# Patient Record
Sex: Female | Born: 1995 | Hispanic: Yes | Marital: Married | Smoking: Never smoker
Health system: Southern US, Community
[De-identification: ages and names within clinical notes are randomized; demographics above are authoritative.]

## PROBLEM LIST (undated history)

## (undated) HISTORY — PX: APPENDECTOMY: SHX54

## (undated) HISTORY — PX: EYE MUSCLE SURGERY: SHX370

---
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
## 2016-04-12 IMAGING — CT CT ABD-PELV W/ CM
2 of 4 series · 16 of 46 positions shown, 18 images · IV contrast (omnipaque)
Comparison: Right upper quadrant ultrasound 6360 hours today.

ADDENDUM:
Study discussed by telephone with RN Rosemond Johnny in the ED on
03/30/2014 at 9997 hours.
CLINICAL DATA: 19-year-old female with upper abdominal pain nausea
and vomiting. Six days postpartum vaginal delivery. Initial
encounter.

EXAM:
CT ABDOMEN AND PELVIS WITH CONTRAST
TECHNIQUE: Multidetector CT imaging of the abdomen and pelvis was performed
using the standard protocol following bolus administration of
intravenous contrast.
CONTRAST:  100 mL Omnipaque 300.

[Series 2: routine abd pel with · axial · 0.67mm/px · z∈[-1196,-781]mm · 13 of 91 slices shown, 15 images]
[im 4/91  soft-tissue]
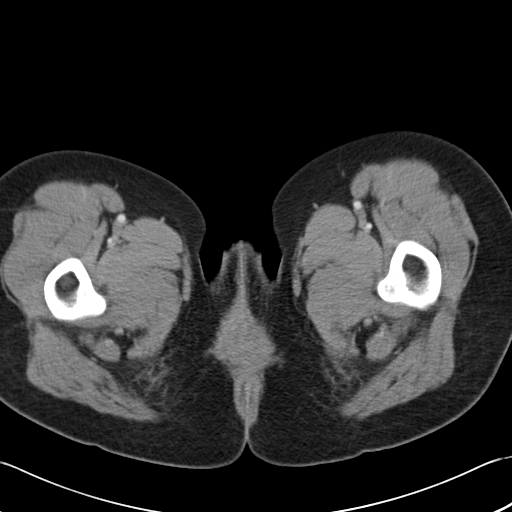
[im 4/91  bone]
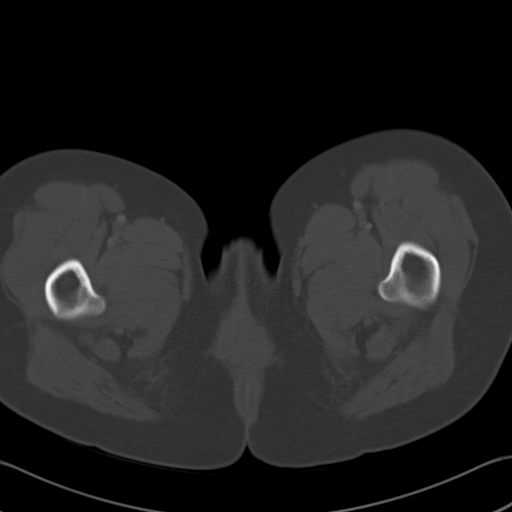
[im 11/91  soft-tissue]
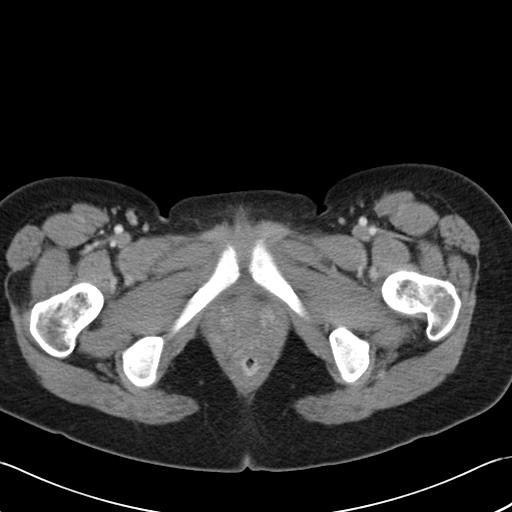
[im 18/91  soft-tissue]
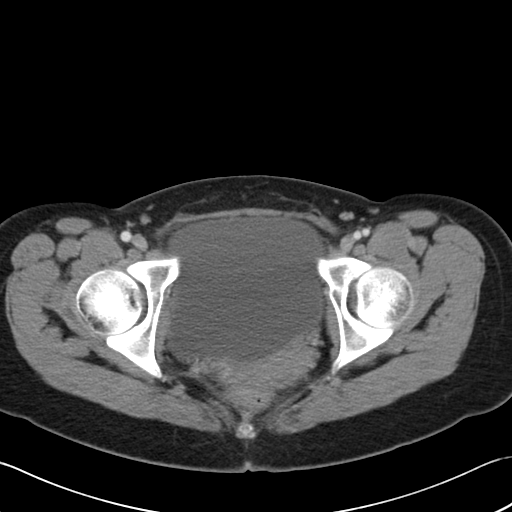
[im 25/91  soft-tissue]
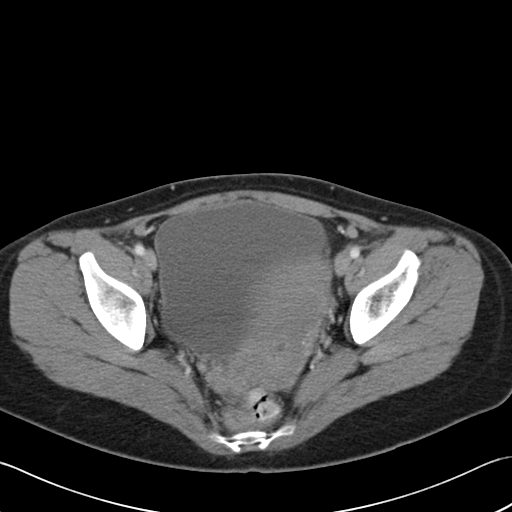
[im 32/91  soft-tissue]
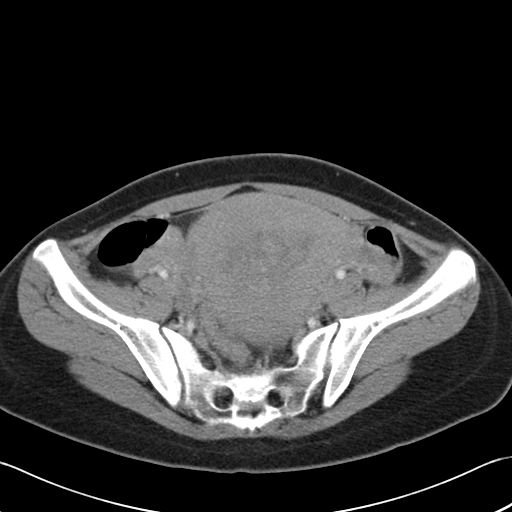
[im 39/91  soft-tissue]
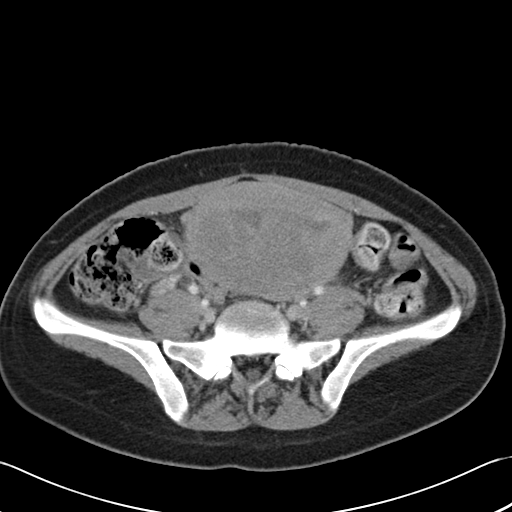
[im 46/91  soft-tissue]
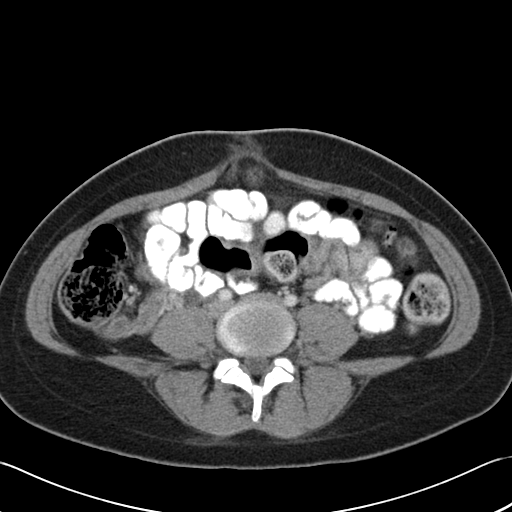
[im 52/91  soft-tissue]
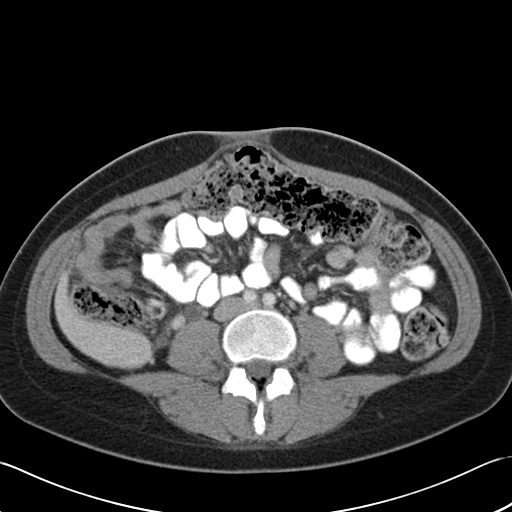
[im 59/91  soft-tissue]
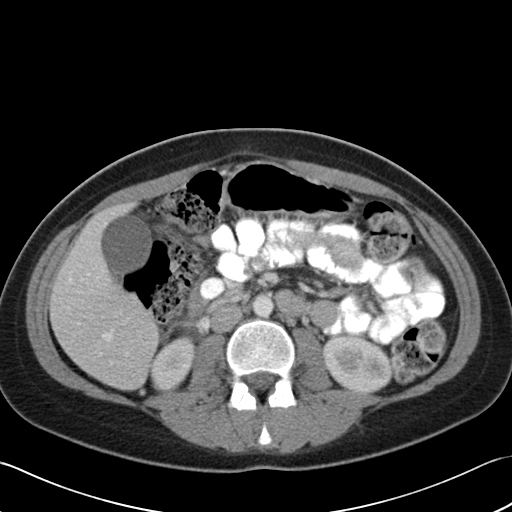
[im 59/91  bone]
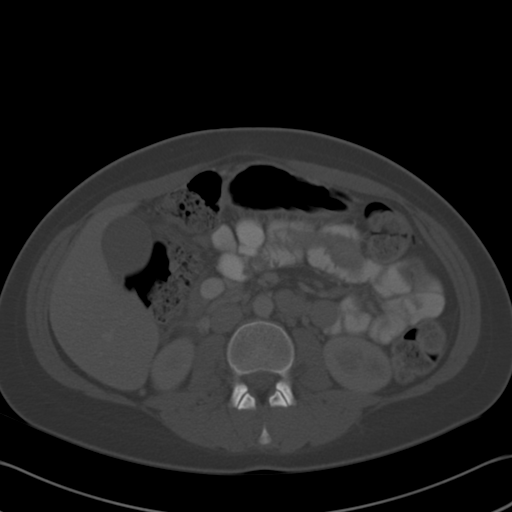
[im 66/91  soft-tissue]
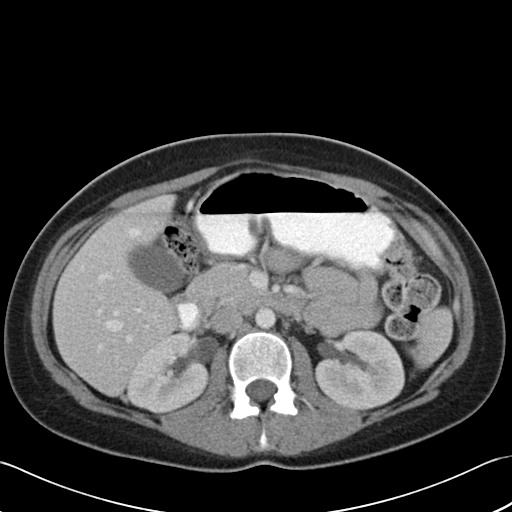
[im 73/91  soft-tissue]
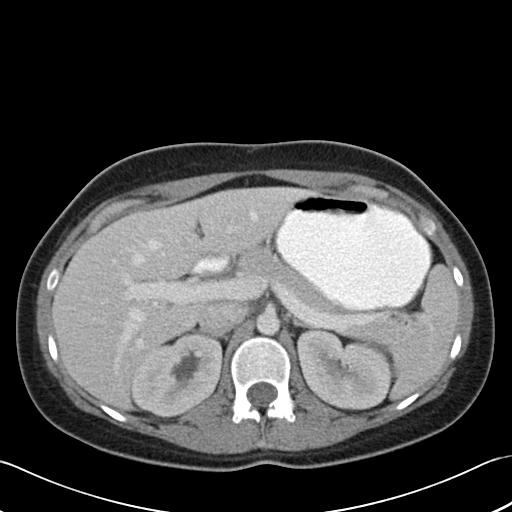
[im 80/91  soft-tissue]
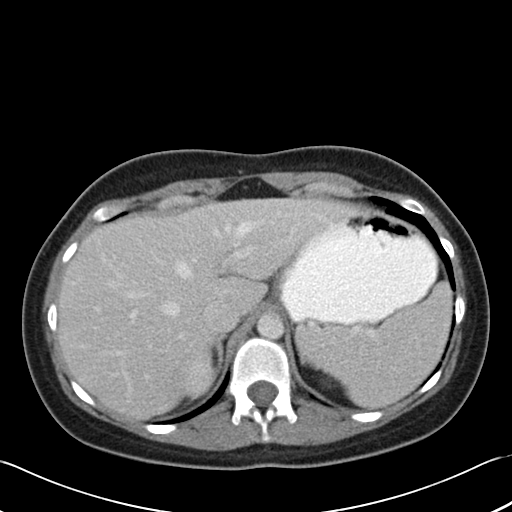
[im 87/91  soft-tissue]
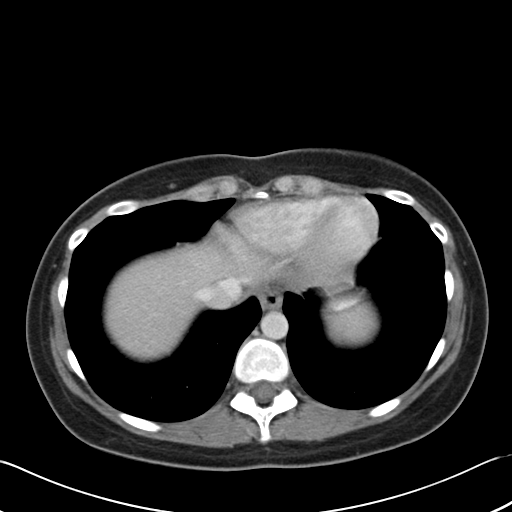

[Series 5: cor routine abd pel with · coronal · 0.67mm/px · 3 of 115 slices shown]
[im 39/115  soft-tissue]
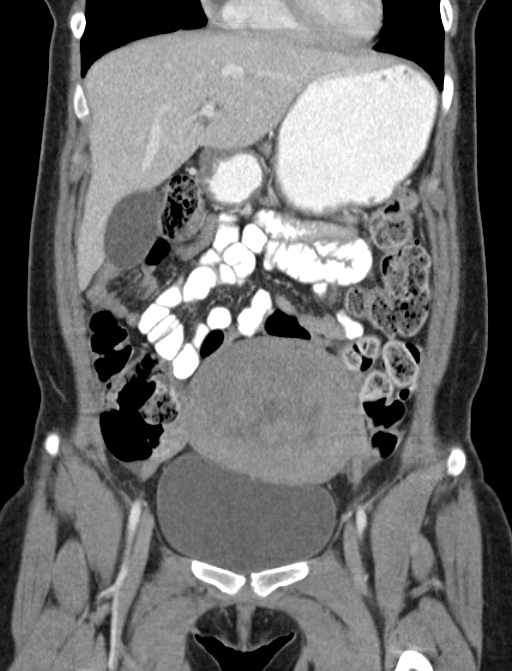
[im 51/115  soft-tissue]
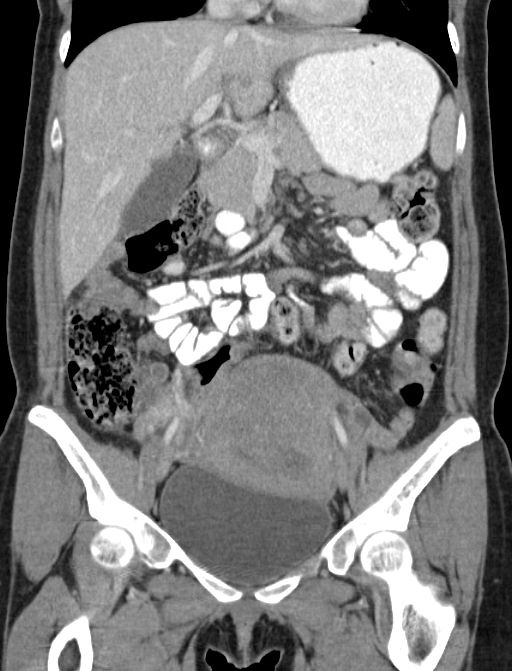
[im 64/115  soft-tissue]
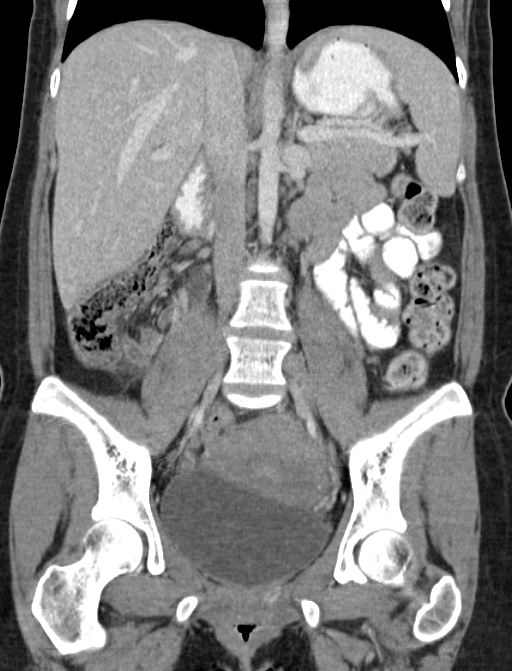

[16 of 46 positions shown; findings below may reference images not displayed]

FINDINGS: Negative lung bases.  No pleural or pericardial effusion.

Small volume gas in knee dorsal epidural space, likely related to
recent epidural anesthesia in this setting. No acute osseous
abnormality identified.

Postpartum uterus. No pelvic free fluid. Negative at adnexa.
Decompressed distal colon. Unremarkable bladder.

Retained stool in the more proximal colon.

There is a trace amount of free fluid in the right lower quadrant.
The appendix is dilated, fluid-filled, and hyperenhancing with
surrounding inflammation. Trace cecal wall thickening at the origin
of the appendix. Terminal ileum within normal limits. No dilated
small bowel. Oral contrast has not yet reached the distal small
bowel. Negative stomach and duodenum.

Liver, gallbladder, spleen, pancreas, and adrenal glands are within
normal limits. Portal venous system within normal limits. Major
arterial structures are patent. Left kidney in ureter are within
normal limits. There is mild right hydronephrosis, and mild to
moderate right hydroureter which appears related to compression from
the uterus. No free fluid in the upper abdomen. No lymphadenopathy.
IMPRESSION: 1. Acute appendicitis. Trace reactive appearing right lower quadrant
free fluid with no perforation or complicating features.
2. Postpartum uterus with unresolved right side maternal
hydronephrosis of pregnancy.
3. Trace dorsal lumbar epidural space gas compatible with recent
epidural anesthesia.

## 2016-05-28 ENCOUNTER — Encounter: Payer: Self-pay | Admitting: Advanced Practice Midwife

## 2016-05-28 ENCOUNTER — Ambulatory Visit (INDEPENDENT_AMBULATORY_CARE_PROVIDER_SITE_OTHER): Payer: BLUE CROSS/BLUE SHIELD | Admitting: Advanced Practice Midwife

## 2016-05-28 VITALS — BP 100/60 | HR 67 | Ht 63.0 in | Wt 134.0 lb

## 2016-05-28 DIAGNOSIS — Z01419 Encounter for gynecological examination (general) (routine) without abnormal findings: Secondary | ICD-10-CM | POA: Diagnosis not present

## 2016-05-28 DIAGNOSIS — Z113 Encounter for screening for infections with a predominantly sexual mode of transmission: Secondary | ICD-10-CM | POA: Diagnosis not present

## 2016-05-28 NOTE — Progress Notes (Signed)
Patient ID: Emily Mosley, female   DOB: 15-Oct-1995, 21 y.o.   MRN: [id number]     Gynecology Annual Exam  PCP: [facility]  Chief Complaint:  Chief Complaint  Patient presents with  . Gynecologic Exam    History of Present Illness: Patient is a 21 y.o. G1P1001 presents for annual exam. The patient has no complaints today.   LMP: Patient's last menstrual period was 05/27/2016. Average Interval: regular, 28 days Duration of flow: 3 days Heavy Menses: no Clots: no Intermenstrual Bleeding: no Postcoital Bleeding: no Dysmenorrhea: no  The patient is sexually active. She currently uses IUD Mirena for contraception. She denies dyspareunia.  The patient does not perform self breast exams.  There is no notable family history of breast or ovarian cancer in her family.  The patient wears seatbelts: yes.  The patient has regular exercise: yes.    The patient denies current symptoms of depression.    Review of Systems: Review of Systems  Constitutional: Negative.   HENT: Negative.   Eyes: Negative.   Respiratory: Negative.   Cardiovascular: Negative.   Gastrointestinal: Negative.   Genitourinary: Negative.   Musculoskeletal: Negative.   Skin: Negative.   Neurological: Negative.   Endo/Heme/Allergies: Negative.   Psychiatric/Behavioral: Negative.     Past Medical History:  No past medical history on file.  Past Surgical History:  Past Surgical History:  Procedure Laterality Date  . APPENDECTOMY      Gynecologic History:  Patient's last menstrual period was 05/27/2016. Contraception: IUD Last Pap: Results were: Pt has not had a PAP smear   Obstetric History: G1P1001  Family History:  No family history on file.  Social History:  Social History   Social History  . Marital status: Single    Spouse name: N/A  . Number of children: N/A  . Years of education: N/A   Occupational History  . Not on file.   Social History Main Topics  .  Smoking status: Never Smoker  . Smokeless tobacco: Never Used  . Alcohol use No  . Drug use: No  . Sexual activity: Yes    Birth control/ protection: IUD   Other Topics Concern  . Not on file   Social History Narrative  . No narrative on file    Allergies:  Not on File  Medications: Prior to Admission medications   Medication Sig Start Date End Date Taking? Authorizing Provider  levonorgestrel (MIRENA) 20 MCG/24HR IUD 1 each by Intrauterine route once.   Yes Historical Provider, MD    Physical Exam Vitals: Blood pressure 100/60, pulse 67, height  (1.6 m), weight 134 lb (60.8 kg), last menstrual period 05/27/2016.  General: NAD HEENT: normocephalic, anicteric Thyroid: no enlargement, no palpable nodules Pulmonary: No increased work of breathing, CTAB Cardiovascular: RRR, distal pulses 2+ Breast: Breast symmetrical, no tenderness, no palpable nodules or masses, no skin or nipple retraction present, no nipple discharge.  No axillary or supraclavicular lymphadenopathy. Abdomen: NABS, soft, non-tender, non-distended.  Umbilicus without lesions.  No hepatomegaly, splenomegaly or masses palpable. No evidence of hernia  Genitourinary:  External: Normal external female genitalia.  Normal urethral meatus, normal  Bartholin's and Skene's glands.    Vagina: Normal vaginal mucosa, no evidence of prolapse.    Cervix: Grossly normal in appearance, no bleeding, no CMT, strings seen  Uterus: Non-enlarged, mobile, normal contour.    Adnexa: ovaries non-enlarged, no adnexal masses  Rectal: deferred  Lymphatic: no evidence of inguinal lymphadenopathy Extremities: no edema,  erythema, or tenderness Neurologic: Grossly intact Psychiatric: mood appropriate, affect full    Assessment: 21 y.o. G1P1001 Well woman exam    Plan: Problem List Items Addressed This Visit    None    Visit Diagnoses    Screen for sexually transmitted diseases    -  Primary   Well woman exam with routine  gynecological exam          1) Continue healthy lifestyle diet and exercise   2) STI screening was offered and declined   3) ASCCP guidelines and rational discussed.  Patient opts for beginning at age 20 screening interval  4) Contraception - IUD Mirena since 2016   5) Follow up 1 year for routine annual exam   Tresea Mall, CNM

## 2016-09-08 ENCOUNTER — Encounter: Payer: Self-pay | Admitting: *Deleted

## 2016-09-08 ENCOUNTER — Ambulatory Visit
Admission: EM | Admit: 2016-09-08 | Discharge: 2016-09-08 | Disposition: A | Discharge status: Home / Self Care | Payer: BLUE CROSS/BLUE SHIELD | Attending: Internal Medicine | Admitting: Internal Medicine

## 2016-09-08 DIAGNOSIS — Z111 Encounter for screening for respiratory tuberculosis: Secondary | ICD-10-CM

## 2016-09-08 DIAGNOSIS — W268XXA Contact with other sharp object(s), not elsewhere classified, initial encounter: Secondary | ICD-10-CM

## 2016-09-08 DIAGNOSIS — S91312A Laceration without foreign body, left foot, initial encounter: Secondary | ICD-10-CM | POA: Diagnosis not present

## 2016-09-08 DIAGNOSIS — Z23 Encounter for immunization: Secondary | ICD-10-CM

## 2016-09-08 MED ORDER — TUBERCULIN PPD 5 UNIT/0.1ML ID SOLN
5.0000 [IU] | Freq: Once | INTRADERMAL | Status: DC
  Administered 2016-09-08: 5 [IU] via INTRADERMAL

## 2016-09-08 MED ORDER — CEPHALEXIN 500 MG PO CAPS: 500.0000 mg | ORAL_CAPSULE | Freq: Two times a day (BID) | ORAL | 0 refills | Status: AC

## 2016-09-08 MED ORDER — MUPIROCIN CALCIUM 2 % EX CREA: 1.0000 "application " | TOPICAL_CREAM | Freq: Two times a day (BID) | CUTANEOUS | 0 refills | Status: DC

## 2016-09-08 MED ORDER — TETANUS-DIPHTH-ACELL PERTUSSIS 5-2.5-18.5 LF-MCG/0.5 IM SUSP
0.5000 mL | Freq: Once | INTRAMUSCULAR | Status: AC
  Administered 2016-09-08: 0.5 mL via INTRAMUSCULAR

## 2016-09-08 NOTE — ED Triage Notes (Addendum)
Patient lacerated her foot when she jumped off of a floating trampoline while on a cruise 2 days ago. Laceration is located on top of the left foot and is presently closed.

## 2016-09-08 NOTE — ED Provider Notes (Addendum)
CSN: [id number]     Arrival date & time 09/08/16  1309 History   First MD Initiated Contact with Patient 09/08/16 1332     Chief Complaint  Patient presents with  . Laceration   (Consider location/radiation/quality/duration/timing/severity/associated sxs/prior Treatment) Patient is a 21 year old female who presents with complaint of laceration to her left foot. Patient states she was jumping on a water trampoline while she was in port on a cruise in [place]. She states that she slipped off the edge and cut the top of her foot on a rusty anchor. States her foot was dirty initially but they cleaned up the best they could but weren't able to get everything out of the. She said [facility] and was told they didn't have anything that they could treat her with or cleaned her wound with. Patient reports she's been trying to keep the wound clean and has been using Neosporin ointment. Patient reports some pain with walking yesterday but reports it is better today. Patient denies any fever.  Patient also requesting TB skin test for school.      History reviewed. No pertinent past medical history. Past Surgical History:  Procedure Laterality Date  . APPENDECTOMY     History reviewed. No pertinent family history. Social History  Substance Use Topics  . Smoking status: Never Smoker  . Smokeless tobacco: Never Used  . Alcohol use No   OB History    Gravida Para Term Preterm AB Living   1 1 1     1    SAB TAB Ectopic Multiple Live Births                 Review of Systems  As noted above in history of present illness. Other systems reviewed and found to be negative.  Allergies  Patient has no known allergies.  Home Medications   Prior to Admission medications   Medication Sig Start Date End Date Taking? Authorizing Provider  levonorgestrel (MIRENA) 20 MCG/24HR IUD 1 each by Intrauterine route once.   Yes [provider]  cephALEXin (KEFLEX) 500 MG capsule Take  1 capsule (500 mg total) by mouth 2 (two) times daily. 09/08/16 09/15/16  Candis SchatzHarris, Camrin Lapre D, PA-C  mupirocin cream (BACTROBAN) 2 % Apply 1 application topically 2 (two) times daily. 09/08/16   Candis SchatzHarris, Okema Rollinson D, PA-C   Meds Ordered and Administered this Visit   Medications  tuberculin injection 5 Units (not administered)  Tdap (BOOSTRIX) injection 0.5 mL (0.5 mLs Intramuscular Given 09/08/16 1342)    BP (!) 108/59 (BP Location: Left Arm)   Pulse 63   Temp 98.4 F (36.9 C) (Oral)   Resp 16   Ht 5\' 3"  (1.6 m)   Wt 128 lb (58.1 kg)   LMP 08/25/2016   SpO2 100%   BMI 22.67 kg/m  No data found.   Physical Exam  Constitutional: She appears well-developed and well-nourished. No distress.  HENT:  Head: Atraumatic.  Eyes: Pupils are equal, round, and reactive to light. EOM are normal.  Neck: Normal range of motion.  Cardiovascular: Normal rate.   Pulmonary/Chest: Effort normal.  Musculoskeletal:       Left foot: There is laceration.       Feet:  Superficial slightly curved laceration over the distal foot onto the proximal portion of the second toe. Minimal erythema. No fever. Slight tenderness to touch. No purulent fluid expressed. Some dark coloration around the curve part of this laceration, likely some retained debris.  Urgent Care Course     Procedures (including critical care time)  Labs Review Labs Reviewed - No data to display  Imaging Review No results found.  The wound was cleaned with a Chloraseptic swab and attempted to remove as much retained debris as possible. Still some debris likely remains inside the laceration but this is covered with a wound has approximated over the last couple days and there is no redness in that area and no purulent fluid was excreted. Wound was covered with antibiotic ointment. Steri-Strips were applied with BenzaClin. 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  We'll place subdermal injection for TB skin test and have patient return in 48-72 hours for check.  MDM   1. Visit for TB skin test   2. Laceration of left foot, initial encounter    New Prescriptions   CEPHALEXIN (KEFLEX) 500 MG CAPSULE    Take 1 capsule (500 mg total) by mouth 2 (two) times daily.   MUPIROCIN CREAM (BACTROBAN) 2 %    Apply 1 application topically 2 (two) times daily.   Patient presents with laceration 2 days ago to her left foot. Wound appears fairly clean and approximated with only some mild erythema towards the end of the lacerations but no fever and no purulent expressions. Some possible debris still remains, but does not appear infected. Steri-Strips applied. Recommended keeping clean with mild soapy water with application Bactroban twice a day. Also give her a prescription for Keflex this laceration and embedded debris. Instructions given for return should she develop signs of infection. Patient verbalized understanding is in agreement with plan.  Candis SchatzMichael D Ambra Haverstick, PA-C      Candis SchatzHarris, Edison Nicholson D, PA-C 09/08/16 1406    Candis SchatzHarris, Aneesh Faller D, PA-C 09/08/16 1426

## 2016-09-08 NOTE — Discharge Instructions (Signed)
-  cephalexin: twice a day for 7 days -bactroban: twice a day with would care -keep clean with warm soapy water -closure strips should remain until they come off on their own -return to clinic should you develop signs of infection or increased pain. Redness, pain, fever, swelling, purulent drainage

## 2016-09-10 ENCOUNTER — Ambulatory Visit
Admission: EM | Admit: 2016-09-10 | Discharge: 2016-09-10 | Disposition: A | Discharge status: Home / Self Care | Payer: BLUE CROSS/BLUE SHIELD | Attending: Family Medicine | Admitting: Family Medicine

## 2016-09-10 DIAGNOSIS — R7611 Nonspecific reaction to tuberculin skin test without active tuberculosis: Secondary | ICD-10-CM | POA: Diagnosis not present

## 2016-09-10 NOTE — ED Notes (Addendum)
PPD Result Negative 0mm

## 2016-09-10 NOTE — ED Triage Notes (Signed)
Patient here for PPD reading. Result Negative. 0mm

## 2016-10-17 ENCOUNTER — Telehealth: Payer: Self-pay

## 2016-10-17 NOTE — Telephone Encounter (Signed)
Pt has had mirena for 2 1/681yrs. Periods have been steady occurring every month but recently lighter than usual did not have a period last month.  Doesn't ck for strings.  What to do?  [phone]

## 2016-10-18 NOTE — Telephone Encounter (Signed)
Called and spoke with patient able scheduling. Pt refused to schedule and stated she would call back to schedule appointment

## 2016-10-18 NOTE — Telephone Encounter (Signed)
Called and left voicemail for patient to call back to be schedule for an IUD check

## 2016-10-18 NOTE — Telephone Encounter (Signed)
Pt is returning a call from yesterday.   According to [employer] Message. Looks like she needs an IUD check. Please schedule

## 2016-11-20 ENCOUNTER — Ambulatory Visit: Payer: BLUE CROSS/BLUE SHIELD | Admitting: Advanced Practice Midwife

## 2017-01-28 ENCOUNTER — Ambulatory Visit
Admission: EM | Admit: 2017-01-28 | Discharge: 2017-01-28 | Disposition: A | Discharge status: Home / Self Care | Payer: BLUE CROSS/BLUE SHIELD | Attending: Family Medicine | Admitting: Family Medicine

## 2017-01-28 ENCOUNTER — Other Ambulatory Visit: Payer: Self-pay

## 2017-01-28 DIAGNOSIS — J029 Acute pharyngitis, unspecified: Secondary | ICD-10-CM

## 2017-01-28 DIAGNOSIS — J069 Acute upper respiratory infection, unspecified: Secondary | ICD-10-CM | POA: Diagnosis not present

## 2017-01-28 LAB — RAPID STREP SCREEN (MED CTR MEBANE ONLY): STREPTOCOCCUS, GROUP A SCREEN (DIRECT): NEGATIVE

## 2017-01-28 MED ORDER — FLUTICASONE PROPIONATE 50 MCG/ACT NA SUSP: 2.0000 | Freq: Every day | NASAL | 0 refills | Status: DC

## 2017-01-28 NOTE — ED Provider Notes (Signed)
[facility]  CSN: [id number] Arrival date & time: 01/28/17  0825  History   Chief Complaint Chief Complaint  Patient presents with  . Sore Throat   HPI  21 year old female presents with ear pressure, sore throat.  Patient states that she has been sick for the past 4 days.  She has had ongoing sore throat.  Mild to moderate.  Associated ear pressure bilaterally.  She also reports ringing in her left ear.  She been taking Tylenol without resolution.  No fevers or chills.  No other associated symptoms.  No other complaints or concerns at this time.  History reviewed. No pertinent past medical history.  Past Surgical History:  Procedure Laterality Date  . APPENDECTOMY     OB History    Gravida Para Term Preterm AB Living   1 1 1     1    SAB TAB Ectopic Multiple Live Births                 Home Medications    Family History History reviewed. No pertinent family history.  Social History Social History   Tobacco Use  . Smoking status: Never Smoker  . Smokeless tobacco: Never Used  Substance Use Topics  . Alcohol use: No  . Drug use: No    Allergies   Patient has no known allergies.   Review of Systems Review of Systems  Constitutional: Negative for fever.  HENT: Positive for sore throat and tinnitus.        Ear pressure.    Physical Exam Triage Vital Signs ED Triage Vitals  Enc Vitals Group     BP 01/28/17 0846 119/72     Pulse Rate 01/28/17 0846 75     Resp 01/28/17 0846 16     Temp 01/28/17 0846 98.2 F (36.8 C)     Temp Source 01/28/17 0846 Oral     SpO2 01/28/17 0846 99 %     Weight 01/28/17 0843 130 lb (59 kg)     Height 01/28/17 0843 5\' 4"  (1.626 m)     Head Circumference --      Peak Flow --      Pain Score 01/28/17 0843 5     Pain Loc --      Pain Edu? --      Excl. in GC? --    Updated Vital Signs BP 119/72 (BP Location: Left Arm)   Pulse 75   Temp 98.2 F (36.8 C) (Oral)   Resp 16   Ht 5\' 4"  (1.626 m)   Wt 130 lb (59  kg)   LMP 01/07/2017   SpO2 99%   BMI 22.31 kg/m     Physical Exam  Constitutional: She is oriented to person, place, and time. She appears well-developed and well-nourished. No distress.  HENT:  Head: Normocephalic and atraumatic.  Mouth/Throat: Oropharynx is clear and moist.  Normal TM's bilaterally.  Eyes: Conjunctivae are normal. Right eye exhibits no discharge. Left eye exhibits no discharge.  Neck: Neck supple.  Cardiovascular: Normal rate and regular rhythm.  No murmur heard. Pulmonary/Chest: Effort normal and breath sounds normal. She has no wheezes. She has no rales.  Lymphadenopathy:    She has no cervical adenopathy.  Neurological: She is alert and oriented to person, place, and time.  Psychiatric: She has a normal mood and affect. Her behavior is normal.  Vitals reviewed.  UC Treatments / Results  Labs (all labs ordered are listed, but only abnormal results are displayed)  Labs Reviewed  RAPID STREP SCREEN (NOT AT [facility])  CULTURE, GROUP A STREP [facility])    EKG  EKG Interpretation None       Radiology No results found.  Procedures Procedures (including critical care time)  Medications Ordered in UC Medications - No data to display   Initial Impression / Assessment and Plan / UC Course  I have reviewed the triage vital signs and the nursing notes.  Pertinent labs & imaging results that were available during my care of the patient were reviewed by me and considered in my medical decision making (see chart for details).     21 year old female presents with a viral respiratory illness.  Supportive care.  Flonase as directed.  Final Clinical Impressions(s) / UC Diagnoses   Final diagnoses:  Viral upper respiratory tract infection    ED Discharge Orders        Ordered    fluticasone (FLONASE) 50 MCG/ACT nasal spray  Daily     01/28/17 0906     Controlled Substance Prescriptions [place] Controlled Substance Registry consulted? Not Applicable   Tommie SamsCook,  Cornella Emmer G, [place] 01/28/17 [id number]

## 2017-01-28 NOTE — Discharge Instructions (Signed)
Use the flonase as prescribed. It should help with the ears.  Tylenol/Motrin as needed for the sore throat.  Take care  Dr. Adriana Simasook

## 2017-01-28 NOTE — ED Triage Notes (Signed)
Patient complains of bilateral ear pressure, left ear ringing, and sore throat x 4 days.

## 2017-01-30 LAB — CULTURE, GROUP A STREP (THRC)

## 2017-02-12 NOTE — L&D Delivery Note (Signed)
Delivery Note Primary OB: [facility] Physician: Annamarie MajorPaul Nhat Hearne, MD Gestational Age: Full term Antepartum complications: none Intrapartum complications: None  A viable Female was delivered via vertex perentation.  Apgars:8 ,9  Weight:  pending .   Placenta status: spontaneous and Intact.  Cord: 3+ vessels;  with the following complications: nuchal.  Anesthesia:  epidural Episiotomy:  none Lacerations:  none Suture Repair: none Est. Blood Loss (mL):  100 mL  Mom to postpartum.  Baby to Couplet care / Skin to Skin.  Annamarie MajorPaul Joby Richart, MD, Merlinda FrederickFACOG [facility], [facility] 01/30/2018  9:56 PM [phone]

## 2017-04-15 ENCOUNTER — Ambulatory Visit (INDEPENDENT_AMBULATORY_CARE_PROVIDER_SITE_OTHER): Payer: BLUE CROSS/BLUE SHIELD | Admitting: Maternal Newborn

## 2017-04-15 ENCOUNTER — Encounter: Payer: Self-pay | Admitting: Maternal Newborn

## 2017-04-15 VITALS — BP 110/70 | HR 72 | Ht 63.0 in | Wt 136.0 lb

## 2017-04-15 DIAGNOSIS — Z30432 Encounter for removal of intrauterine contraceptive device: Secondary | ICD-10-CM | POA: Diagnosis not present

## 2017-04-15 NOTE — Progress Notes (Signed)
       GYNECOLOGY OFFICE PROCEDURE NOTE  Emily Mosley is a 22 y.o. G1P1001 here for Mirena IUD removal placed in 2016. She desires removal secondary to possibly trying to conceive.  IUD Removal  Patient identified, informed consent performed, consent signed.  Patient was in the dorsal lithotomy position, normal external genitalia was noted.  A speculum was placed in the patient's vagina, normal discharge was noted, no lesions. The cervix was visualized, no lesions, no abnormal discharge.  The strings of the IUD were grasped and pulled using ring forceps. The IUD was removed in its entirety. Patient tolerated the procedure well.    Patient will use no method for contraception. She is possibly planning for pregnancy soon and she was told to avoid teratogens, take PNV and folic acid.  Routine preventative health maintenance measures emphasized.   Marcelyn BruinsJacelyn Ivylynn Hoppes, CNM [facility], [facility]

## 2017-06-10 ENCOUNTER — Encounter: Payer: Self-pay | Admitting: Maternal Newborn

## 2017-06-10 ENCOUNTER — Telehealth: Payer: Self-pay

## 2017-06-10 ENCOUNTER — Ambulatory Visit (INDEPENDENT_AMBULATORY_CARE_PROVIDER_SITE_OTHER): Payer: BLUE CROSS/BLUE SHIELD | Admitting: Maternal Newborn

## 2017-06-10 VITALS — BP 100/80 | Wt 136.0 lb

## 2017-06-10 DIAGNOSIS — Z3481 Encounter for supervision of other normal pregnancy, first trimester: Secondary | ICD-10-CM

## 2017-06-10 DIAGNOSIS — Z3490 Encounter for supervision of normal pregnancy, unspecified, unspecified trimester: Secondary | ICD-10-CM | POA: Insufficient documentation

## 2017-06-10 DIAGNOSIS — Z0189 Encounter for other specified special examinations: Secondary | ICD-10-CM

## 2017-06-10 HISTORY — DX: Encounter for supervision of other normal pregnancy, first trimester: Z34.81

## 2017-06-10 NOTE — Progress Notes (Signed)
06/10/2017   Chief Complaint: Amenorrhea, positive home pregnancy test, desires prenatal care.  Transfer of Care Patient: no  History of Present Illness: Emily Mosley is a 22 y.o. G2P1001 at [redacted]w[redacted]d based on Patient's last menstrual period on 05/02/2017 (exact date), with an Estimated Date of Delivery: 02/06/18, with the above CC.   Her periods were: regular periods every 28 days She was using no method when she conceived.  She has Negative signs or symptoms of nausea/vomiting of pregnancy. She has Negative signs or symptoms of miscarriage or preterm labor She identifies Negative Zika risk factors for her and her partner On any different medications around the time she conceived/early pregnancy: No  History of varicella: No   ROS: A 12-point review of systems was performed and negative, except as stated in the above HPI.  OBGYN History: As per HPI. OB History  Gravida Para Term Preterm AB Living  SAB TAB Ectopic Multiple Live Births          1    # Outcome Date GA Lbr Len/2nd Weight Sex Delivery Anes PTL Lv  2 Current           1 Term 03/24/14 [redacted]w[redacted]d  9 lb 3.2 oz (4.173 kg) M Vag-Spont   LIV     Birth Comments: SHOULDER DYSTOCIA    Any issues with any prior pregnancies: yes, shoulder dystocia with G1 Any prior children are healthy, doing well, without any problems or issues: yes History of pap smears: No, to be done next visit as she had her young child with her today. History of STIs: No   Past Medical History: History reviewed. No pertinent past medical history.  Past Surgical History: Past Surgical History:  Procedure Laterality Date  . APPENDECTOMY    . EYE MUSCLE SURGERY     AGE 80  . WISDOM TOOTH EXTRACTION  2015   ALL FOUR    Family History:  Family History  Problem Relation Age of Onset  . Thyroid disease Maternal Grandmother        HYPO    She denies any female cancers, bleeding or blood clotting disorders.  She denies any history of  intellectual disability, birth defects or genetic disorders in her or the FOB's history  Social History:  Social History   Socioeconomic History  . Marital status: Married    Spouse name: Not on file  . Number of children: 1  . Years of education: 64  . Highest education level: Not on file  Occupational History  . Occupation: Teacher, adult education: [employer]  Social Needs  . Financial resource strain: Not on file  . Food insecurity:    Worry: Not on file    Inability: Not on file  . Transportation needs:    Medical: Not on file    Non-medical: Not on file  Tobacco Use  . Smoking status: Never Smoker  . Smokeless tobacco: Never Used  Substance and Sexual Activity  . Alcohol use: No  . Drug use: No  . Sexual activity: Yes    Birth control/protection: None  Lifestyle  . Physical activity:    Days per week: Not on file    Minutes per session: Not on file  . Stress: Not on file  Relationships  . Social connections:    Talks on phone: Not on file    Gets together: Not on file    Attends religious service: Not on file  Active member of club or organization: Not on file    Attends meetings of clubs or organizations: Not on file    Relationship status: Not on file  . Intimate partner violence:    Fear of current or ex partner: Not on file    Emotionally abused: Not on file    Physically abused: Not on file    Forced sexual activity: Not on file  Other Topics Concern  . Not on file  Social History Narrative  . Not on file   Any cats in the household: no Denies history of and current domestic violence.  Allergy: No Known Allergies  Current Outpatient Medications: No current outpatient medications on file.   Physical Exam:   BP 100/80   Wt 136 lb (61.7 kg)   LMP 05/02/2017 (Exact Date)   BMI 24.09 kg/m  Body mass index is 24.09 kg/m. Constitutional: Well nourished, well developed female in no acute distress.  Neck:  Supple, normal appearance, and no thyromegaly    Cardiovascular: S1, S2 normal, no murmur, rub or gallop, regular rate and rhythm Respiratory:  Clear to auscultation bilaterally. Normal respiratory effort Abdomen: no masses, hernias; diffusely non tender to palpation, non distended Breasts: breasts appear normal, no suspicious masses, no skin or nipple changes or axillary nodes. Neuro/Psych:  Normal mood and affect.  Skin:  Warm and dry.  Lymphatic:  No inguinal lymphadenopathy.   Pelvic exam: declines this time because her child is present, will complete next visit with Pap.  Assessment: Emily Mosley is a 22 y.o. G2P1001 at [redacted]w[redacted]d based on Patient's last menstrual period on  05/02/2017 (exact date), with an Estimated Date of Delivery: 02/06/18, presenting for prenatal care.  Plan:  1) Avoid alcoholic beverages. 2) Patient encouraged not to smoke.  3) Discontinue the use of all non-medicinal drugs and chemicals.  4) Take prenatal vitamins daily.  5) Seatbelt use advised 6) Nutrition, food safety (fish, cheese advisories, and high nitrite foods) and exercise discussed. 7) Hospital and practice style delivering at [facility] discussed  8) Patient is asked about travel to areas at risk for the Zika virus, and counseled to avoid travel and exposure to mosquitoes or sexual partners who may have themselves been exposed to the virus. Testing is discussed, and will be ordered as appropriate.  9) Childbirth classes at [facility] advised 10) Genetic Screening, such as with 1st Trimester Screening, cell free fetal DNA, AFP testing, and Ultrasound, as well as with amniocentesis and CVS as appropriate, is discussed with patient. She plans to have genetic testing this pregnancy. To be ordered at appropriate gestational age. 11) NOB labs and Pap+pelvic exam next visit. 12) Dating and viability ultrasound ordered.   Problem list reviewed and updated.  Marcelyn Bruins, CNM [facility], [facility] 06/10/2017  10:22 AM

## 2017-06-10 NOTE — Telephone Encounter (Signed)
Pt seen for NOB today. She has a cruise to the [place] planned in August when she will be about 22 wks and wants to know if that will be okay and what are the concerns for zika. Please advise. Cb# [phone] thank you

## 2017-06-10 NOTE — Patient Instructions (Signed)
First Trimester of Pregnancy The first trimester of pregnancy is from week 1 until the end of week 13 (months 1 through 3). A week after a sperm fertilizes an egg, the egg will implant on the wall of the uterus. This embryo will begin to develop into a baby. Genes from you and your partner will form the baby. The female genes will determine whether the baby will be a boy or a girl. At 6-8 weeks, the eyes and face will be formed, and the heartbeat can be seen on ultrasound. At the end of 12 weeks, all the baby's organs will be formed. Now that you are pregnant, you will want to do everything you can to have a healthy baby. Two of the most important things are to get good prenatal care and to follow your health care provider's instructions. Prenatal care is all the medical care you receive before the baby's birth. This care will help prevent, find, and treat any problems during the pregnancy and childbirth. Body changes during your first trimester Your body goes through many changes during pregnancy. The changes vary from woman to woman.  You may gain or lose a couple of pounds at first.  You may feel sick to your stomach (nauseous) and you may throw up (vomit). If the vomiting is uncontrollable, call your health care provider.  You may tire easily.  You may develop headaches that can be relieved by medicines. All medicines should be approved by your health care provider.  You may urinate more often. Painful urination may mean you have a bladder infection.  You may develop heartburn as a result of your pregnancy.  You may develop constipation because certain hormones are causing the muscles that push stool through your intestines to slow down.  You may develop hemorrhoids or swollen veins (varicose veins).  Your breasts may begin to grow larger and become tender. Your nipples may stick out more, and the tissue that surrounds them (areola) may become darker.  Your gums may bleed and may be  sensitive to brushing and flossing.  Dark spots or blotches (chloasma, mask of pregnancy) may develop on your face. This will likely fade after the baby is born.  Your menstrual periods will stop.  You may have a loss of appetite.  You may develop cravings for certain kinds of food.  You may have changes in your emotions from day to day, such as being excited to be pregnant or being concerned that something may go wrong with the pregnancy and baby.  You may have more vivid and strange dreams.  You may have changes in your hair. These can include thickening of your hair, rapid growth, and changes in texture. Some women also have hair loss during or after pregnancy, or hair that feels dry or thin. Your hair will most likely return to normal after your baby is born.  What to expect at prenatal visits During a routine prenatal visit:  You will be weighed to make sure you and the baby are growing normally.  Your blood pressure will be taken.  Your abdomen will be measured to track your baby's growth.  The fetal heartbeat will be listened to between weeks 10 and 14 of your pregnancy.  Test results from any previous visits will be discussed.  Your health care provider may ask you:  How you are feeling.  If you are feeling the baby move.  If you have had any abnormal symptoms, such as leaking fluid, bleeding, severe headaches,   or abdominal cramping.  If you are using any tobacco products, including cigarettes, chewing tobacco, and electronic cigarettes.  If you have any questions.  Other tests that may be performed during your first trimester include:  Blood tests to find your blood type and to check for the presence of any previous infections. The tests will also be used to check for low iron levels (anemia) and protein on red blood cells (Rh antibodies). Depending on your risk factors, or if you previously had diabetes during pregnancy, you may have tests to check for high blood  sugar that affects pregnant women (gestational diabetes).  Urine tests to check for infections, diabetes, or protein in the urine.  An ultrasound to confirm the proper growth and development of the baby.  Fetal screens for spinal cord problems (spina bifida) and Down syndrome.  HIV (human immunodeficiency virus) testing. Routine prenatal testing includes screening for HIV, unless you choose not to have this test.  You may need other tests to make sure you and the baby are doing well.  Follow these instructions at home: Medicines  Follow your health care provider's instructions regarding medicine use. Specific medicines may be either safe or unsafe to take during pregnancy.  Take a prenatal vitamin that contains at least 600 micrograms (mcg) of folic acid.  If you develop constipation, try taking a stool softener if your health care provider approves. Eating and drinking  Eat a balanced diet that includes fresh fruits and vegetables, whole grains, good sources of protein such as meat, eggs, or tofu, and low-fat dairy. Your health care provider will help you determine the amount of weight gain that is right for you.  Avoid raw meat and uncooked cheese. These carry germs that can cause birth defects in the baby.  Eating four or five small meals rather than three large meals a day may help relieve nausea and vomiting. If you start to feel nauseous, eating a few soda crackers can be helpful. Drinking liquids between meals, instead of during meals, also seems to help ease nausea and vomiting.  Limit foods that are high in fat and processed sugars, such as fried and sweet foods.  To prevent constipation: ? Eat foods that are high in fiber, such as fresh fruits and vegetables, whole grains, and beans. ? Drink enough fluid to keep your urine clear or pale yellow. Activity  Exercise only as directed by your health care provider. Most women can continue their usual exercise routine during  pregnancy. Try to exercise for 30 minutes at least 5 days a week. Exercising will help you: ? Control your weight. ? Stay in shape. ? Be prepared for labor and delivery.  Experiencing pain or cramping in the lower abdomen or lower back is a good sign that you should stop exercising. Check with your health care provider before continuing with normal exercises.  Try to avoid standing for long periods of time. Move your legs often if you must stand in one place for a long time.  Avoid heavy lifting.  Wear low-heeled shoes and practice good posture.  You may continue to have sex unless your health care provider tells you not to. Relieving pain and discomfort  Wear a good support bra to relieve breast tenderness.  Take warm sitz baths to soothe any pain or discomfort caused by hemorrhoids. Use hemorrhoid cream if your health care provider approves.  Rest with your legs elevated if you have leg cramps or low back pain.  If you develop   varicose veins in your legs, wear support hose. Elevate your feet for 15 minutes, 3-4 times a day. Limit salt in your diet. Prenatal care  Schedule your prenatal visits by the twelfth week of pregnancy. They are usually scheduled monthly at first, then more often in the last 2 months before delivery.  Write down your questions. Take them to your prenatal visits.  Keep all your prenatal visits as told by your health care provider. This is important. Safety  Wear your seat belt at all times when driving.  Make a list of emergency phone numbers, including numbers for family, friends, the hospital, and police and fire departments. General instructions  Ask your health care provider for a referral to a local prenatal education class. Begin classes no later than the beginning of month 6 of your pregnancy.  Ask for help if you have counseling or nutritional needs during pregnancy. Your health care provider can offer advice or refer you to specialists for help  with various needs.  Do not use hot tubs, steam rooms, or saunas.  Do not douche or use tampons or scented sanitary pads.  Do not cross your legs for long periods of time.  Avoid cat litter boxes and soil used by cats. These carry germs that can cause birth defects in the baby and possibly loss of the fetus by miscarriage or stillbirth.  Avoid all smoking, herbs, alcohol, and medicines not prescribed by your health care provider. Chemicals in these products affect the formation and growth of the baby.  Do not use any products that contain nicotine or tobacco, such as cigarettes and e-cigarettes. If you need help quitting, ask your health care provider. You may receive counseling support and other resources to help you quit.  Schedule a dentist appointment. At home, brush your teeth with a soft toothbrush and be gentle when you floss. Contact a health care provider if:  You have dizziness.  You have mild pelvic cramps, pelvic pressure, or nagging pain in the abdominal area.  You have persistent nausea, vomiting, or diarrhea.  You have a bad smelling vaginal discharge.  You have pain when you urinate.  You notice increased swelling in your face, hands, legs, or ankles.  You are exposed to fifth disease or chickenpox.  You are exposed to German measles (rubella) and have never had it. Get help right away if:  You have a fever.  You are leaking fluid from your vagina.  You have spotting or bleeding from your vagina.  You have severe abdominal cramping or pain.  You have rapid weight gain or loss.  You vomit blood or material that looks like coffee grounds.  You develop a severe headache.  You have shortness of breath.  You have any kind of trauma, such as from a fall or a car accident. Summary  The first trimester of pregnancy is from week 1 until the end of week 13 (months 1 through 3).  Your body goes through many changes during pregnancy. The changes vary from  woman to woman.  You will have routine prenatal visits. During those visits, your health care provider will examine you, discuss any test results you may have, and talk with you about how you are feeling. This information is not intended to replace advice given to you by your health care provider. Make sure you discuss any questions you have with your health care provider. Document Released: 01/23/2001 Document Revised: 01/11/2016 Document Reviewed: 01/11/2016 Elsevier Interactive Patient Education  2018 [employer]   Inc[employer]  

## 2017-06-10 NOTE — Progress Notes (Signed)
No concerns.rj 

## 2017-06-12 LAB — URINE DRUG PANEL 7
Amphetamines, Urine: NEGATIVE ng/mL
Barbiturate Quant, Ur: NEGATIVE ng/mL
Benzodiazepine Quant, Ur: NEGATIVE ng/mL
Cannabinoid Quant, Ur: NEGATIVE ng/mL
Cocaine (Metab.): NEGATIVE ng/mL
OPIATE QUANT UR: NEGATIVE ng/mL
PCP QUANT UR: NEGATIVE ng/mL

## 2017-06-12 LAB — URINE CULTURE: ORGANISM ID, BACTERIA: NO GROWTH

## 2017-06-12 LAB — GC/CHLAMYDIA PROBE AMP
CHLAMYDIA, DNA PROBE: NEGATIVE
NEISSERIA GONORRHOEAE BY PCR: NEGATIVE

## 2017-06-19 ENCOUNTER — Other Ambulatory Visit: Payer: Self-pay | Admitting: Maternal Newborn

## 2017-06-19 ENCOUNTER — Ambulatory Visit (INDEPENDENT_AMBULATORY_CARE_PROVIDER_SITE_OTHER): Payer: BLUE CROSS/BLUE SHIELD

## 2017-06-19 ENCOUNTER — Ambulatory Visit (INDEPENDENT_AMBULATORY_CARE_PROVIDER_SITE_OTHER): Payer: BLUE CROSS/BLUE SHIELD | Admitting: Maternal Newborn

## 2017-06-19 ENCOUNTER — Encounter: Payer: Self-pay | Admitting: Maternal Newborn

## 2017-06-19 VITALS — BP 100/62 | Wt 128.0 lb

## 2017-06-19 DIAGNOSIS — Z3481 Encounter for supervision of other normal pregnancy, first trimester: Secondary | ICD-10-CM

## 2017-06-19 DIAGNOSIS — Z3A01 Less than 8 weeks gestation of pregnancy: Secondary | ICD-10-CM

## 2017-06-19 DIAGNOSIS — Z0189 Encounter for other specified special examinations: Secondary | ICD-10-CM

## 2017-06-19 LAB — OB RESULTS CONSOLE GC/CHLAMYDIA: Gonorrhea: NEGATIVE

## 2017-06-19 LAB — OB RESULTS CONSOLE VARICELLA ZOSTER ANTIBODY, IGG: Varicella: IMMUNE

## 2017-06-19 NOTE — Progress Notes (Signed)
ROB U/S today 

## 2017-06-19 NOTE — Patient Instructions (Signed)
First Trimester of Pregnancy The first trimester of pregnancy is from week 1 until the end of week 13 (months 1 through 3). A week after a sperm fertilizes an egg, the egg will implant on the wall of the uterus. This embryo will begin to develop into a baby. Genes from you and your partner will form the baby. The female genes will determine whether the baby will be a boy or a girl. At 6-8 weeks, the eyes and face will be formed, and the heartbeat can be seen on ultrasound. At the end of 12 weeks, all the baby's organs will be formed. Now that you are pregnant, you will want to do everything you can to have a healthy baby. Two of the most important things are to get good prenatal care and to follow your health care provider's instructions. Prenatal care is all the medical care you receive before the baby's birth. This care will help prevent, find, and treat any problems during the pregnancy and childbirth. Body changes during your first trimester Your body goes through many changes during pregnancy. The changes vary from woman to woman.  You may gain or lose a couple of pounds at first.  You may feel sick to your stomach (nauseous) and you may throw up (vomit). If the vomiting is uncontrollable, call your health care provider.  You may tire easily.  You may develop headaches that can be relieved by medicines. All medicines should be approved by your health care provider.  You may urinate more often. Painful urination may mean you have a bladder infection.  You may develop heartburn as a result of your pregnancy.  You may develop constipation because certain hormones are causing the muscles that push stool through your intestines to slow down.  You may develop hemorrhoids or swollen veins (varicose veins).  Your breasts may begin to grow larger and become tender. Your nipples may stick out more, and the tissue that surrounds them (areola) may become darker.  Your gums may bleed and may be  sensitive to brushing and flossing.  Dark spots or blotches (chloasma, mask of pregnancy) may develop on your face. This will likely fade after the baby is born.  Your menstrual periods will stop.  You may have a loss of appetite.  You may develop cravings for certain kinds of food.  You may have changes in your emotions from day to day, such as being excited to be pregnant or being concerned that something may go wrong with the pregnancy and baby.  You may have more vivid and strange dreams.  You may have changes in your hair. These can include thickening of your hair, rapid growth, and changes in texture. Some women also have hair loss during or after pregnancy, or hair that feels dry or thin. Your hair will most likely return to normal after your baby is born.  What to expect at prenatal visits During a routine prenatal visit:  You will be weighed to make sure you and the baby are growing normally.  Your blood pressure will be taken.  Your abdomen will be measured to track your baby's growth.  The fetal heartbeat will be listened to between weeks 10 and 14 of your pregnancy.  Test results from any previous visits will be discussed.  Your health care provider may ask you:  How you are feeling.  If you are feeling the baby move.  If you have had any abnormal symptoms, such as leaking fluid, bleeding, severe headaches,   or abdominal cramping.  If you are using any tobacco products, including cigarettes, chewing tobacco, and electronic cigarettes.  If you have any questions.  Other tests that may be performed during your first trimester include:  Blood tests to find your blood type and to check for the presence of any previous infections. The tests will also be used to check for low iron levels (anemia) and protein on red blood cells (Rh antibodies). Depending on your risk factors, or if you previously had diabetes during pregnancy, you may have tests to check for high blood  sugar that affects pregnant women (gestational diabetes).  Urine tests to check for infections, diabetes, or protein in the urine.  An ultrasound to confirm the proper growth and development of the baby.  Fetal screens for spinal cord problems (spina bifida) and Down syndrome.  HIV (human immunodeficiency virus) testing. Routine prenatal testing includes screening for HIV, unless you choose not to have this test.  You may need other tests to make sure you and the baby are doing well.  Follow these instructions at home: Medicines  Follow your health care provider's instructions regarding medicine use. Specific medicines may be either safe or unsafe to take during pregnancy.  Take a prenatal vitamin that contains at least 600 micrograms (mcg) of folic acid.  If you develop constipation, try taking a stool softener if your health care provider approves. Eating and drinking  Eat a balanced diet that includes fresh fruits and vegetables, whole grains, good sources of protein such as meat, eggs, or tofu, and low-fat dairy. Your health care provider will help you determine the amount of weight gain that is right for you.  Avoid raw meat and uncooked cheese. These carry germs that can cause birth defects in the baby.  Eating four or five small meals rather than three large meals a day may help relieve nausea and vomiting. If you start to feel nauseous, eating a few soda crackers can be helpful. Drinking liquids between meals, instead of during meals, also seems to help ease nausea and vomiting.  Limit foods that are high in fat and processed sugars, such as fried and sweet foods.  To prevent constipation: ? Eat foods that are high in fiber, such as fresh fruits and vegetables, whole grains, and beans. ? Drink enough fluid to keep your urine clear or pale yellow. Activity  Exercise only as directed by your health care provider. Most women can continue their usual exercise routine during  pregnancy. Try to exercise for 30 minutes at least 5 days a week. Exercising will help you: ? Control your weight. ? Stay in shape. ? Be prepared for labor and delivery.  Experiencing pain or cramping in the lower abdomen or lower back is a good sign that you should stop exercising. Check with your health care provider before continuing with normal exercises.  Try to avoid standing for long periods of time. Move your legs often if you must stand in one place for a long time.  Avoid heavy lifting.  Wear low-heeled shoes and practice good posture.  You may continue to have sex unless your health care provider tells you not to. Relieving pain and discomfort  Wear a good support bra to relieve breast tenderness.  Take warm sitz baths to soothe any pain or discomfort caused by hemorrhoids. Use hemorrhoid cream if your health care provider approves.  Rest with your legs elevated if you have leg cramps or low back pain.  If you develop   varicose veins in your legs, wear support hose. Elevate your feet for 15 minutes, 3-4 times a day. Limit salt in your diet. Prenatal care  Schedule your prenatal visits by the twelfth week of pregnancy. They are usually scheduled monthly at first, then more often in the last 2 months before delivery.  Write down your questions. Take them to your prenatal visits.  Keep all your prenatal visits as told by your health care provider. This is important. Safety  Wear your seat belt at all times when driving.  Make a list of emergency phone numbers, including numbers for family, friends, the hospital, and police and fire departments. General instructions  Ask your health care provider for a referral to a local prenatal education class. Begin classes no later than the beginning of month 6 of your pregnancy.  Ask for help if you have counseling or nutritional needs during pregnancy. Your health care provider can offer advice or refer you to specialists for help  with various needs.  Do not use hot tubs, steam rooms, or saunas.  Do not douche or use tampons or scented sanitary pads.  Do not cross your legs for long periods of time.  Avoid cat litter boxes and soil used by cats. These carry germs that can cause birth defects in the baby and possibly loss of the fetus by miscarriage or stillbirth.  Avoid all smoking, herbs, alcohol, and medicines not prescribed by your health care provider. Chemicals in these products affect the formation and growth of the baby.  Do not use any products that contain nicotine or tobacco, such as cigarettes and e-cigarettes. If you need help quitting, ask your health care provider. You may receive counseling support and other resources to help you quit.  Schedule a dentist appointment. At home, brush your teeth with a soft toothbrush and be gentle when you floss. Contact a health care provider if:  You have dizziness.  You have mild pelvic cramps, pelvic pressure, or nagging pain in the abdominal area.  You have persistent nausea, vomiting, or diarrhea.  You have a bad smelling vaginal discharge.  You have pain when you urinate.  You notice increased swelling in your face, hands, legs, or ankles.  You are exposed to fifth disease or chickenpox.  You are exposed to German measles (rubella) and have never had it. Get help right away if:  You have a fever.  You are leaking fluid from your vagina.  You have spotting or bleeding from your vagina.  You have severe abdominal cramping or pain.  You have rapid weight gain or loss.  You vomit blood or material that looks like coffee grounds.  You develop a severe headache.  You have shortness of breath.  You have any kind of trauma, such as from a fall or a car accident. Summary  The first trimester of pregnancy is from week 1 until the end of week 13 (months 1 through 3).  Your body goes through many changes during pregnancy. The changes vary from  woman to woman.  You will have routine prenatal visits. During those visits, your health care provider will examine you, discuss any test results you may have, and talk with you about how you are feeling. This information is not intended to replace advice given to you by your health care provider. Make sure you discuss any questions you have with your health care provider. Document Released: 01/23/2001 Document Revised: 01/11/2016 Document Reviewed: 01/11/2016 Elsevier Interactive Patient Education  2018 [employer]   Inc[employer]  

## 2017-06-19 NOTE — Progress Notes (Signed)
    Routine Prenatal Care Visit  Subjective  Emily Mosley is a 22 y.o. G2P1001 at [redacted]w[redacted]d being seen today for ongoing prenatal care.  She is currently monitored for the following issues for this low-risk pregnancy and has Supervision of normal pregnancy on their problem list.  ----------------------------------------------------------------------------------- Patient reports no complaints.   Vag. Bleeding: None.   ----------------------------------------------------------------------------------- The following portions of the patient's history were reviewed and updated as appropriate: allergies, current medications, past family history, past medical history, past social history, past surgical history and problem list. Problem list updated.   Objective  Blood pressure 100/62, weight 128 lb (58.1 kg), last menstrual period 05/02/2017. Pregravid weight 132 lb (59.9 kg) Total Weight Gain -4 lb (-1.814 kg) Urinalysis: Urine Protein: Negative Urine Glucose: Negative  Fetal Status: Fetal Heart Rate (bpm): 110         General:  Alert, oriented and cooperative. Patient is in no acute distress.  Skin: Skin is warm and dry. No rash noted.   Cardiovascular: Normal heart rate noted  Respiratory: Normal respiratory effort, no problems with respiration noted  Abdomen: Soft, gravid, appropriate for gestational age.       Pelvic:  Cervical exam deferred        Extremities: Normal range of motion.     Mental Status: Normal mood and affect. Normal behavior. Normal judgment and thought content.     Assessment   22 y.o. G2P1001 at [redacted]w[redacted]d, EDD 02/06/2018 by Last Menstrual Period presenting for routine prenatal visit.  Plan   SECOND Problems (from 06/10/17 to present)    Problem Noted Resolved   Supervision of normal pregnancy 06/10/2017 by Oswaldo Conroy, CNM No   Overview Signed 06/10/2017  6:56 PM by Oswaldo Conroy, CNM    [facility]inic [facility] Prenatal Labs  Dating  Blood type:     Genetic  Screen 1 Screen:    AFP:     Quad:     NIPS: Antibody:   Anatomic [place]  Rubella:   Varicella:    GTT Early:               Third trimester:  RPR:     Rhogam  HBsAg:     TDaP vaccine                       Flu Shot: HIV:     Baby Food                                GBS:   Contraception  Pap:  CBB     CS/VBAC    Support Person                 Ultrasound today shows single IUP with FHR 110 and size=dates. She accepts genetic screening and will have MaterniTi 21 screen at her next visit. Complete NOB labs today. She would like to defer her Pap smear to postpartum. Discussed Zika precautions because she is planning a cruise, aware to avoid areas with recent outbreaks and mosquito infested locations and to protect from insect bites with repellant, but generally low risk due to limited time on land.  Please refer to After Visit Summary for other counseling recommendations.   Return in about 1 month (around 07/17/2017) for ROB and genetic screen.  Marcelyn Bruins, CNM 06/19/2017  12:34 PM

## 2017-06-21 LAB — HEMOGLOBINOPATHY EVALUATION
HEMOGLOBIN A2 QUANTITATION: 2.3 % (ref 1.8–3.2)
HGB C: 0 %
HGB S: 0 %
HGB VARIANT: 0 %
Hemoglobin F Quantitation: 0 % (ref 0.0–2.0)
Hgb A: 97.7 % (ref 96.4–98.8)

## 2017-06-21 LAB — RPR+RH+ABO+RUB AB+AB SCR+CB...
Antibody Screen: NEGATIVE
HEMOGLOBIN: 12.7 g/dL (ref 11.1–15.9)
HEP B S AG: NEGATIVE
HIV SCREEN 4TH GENERATION: NONREACTIVE
Hematocrit: 37.2 % (ref 34.0–46.6)
MCH: 29.5 pg (ref 26.6–33.0)
MCHC: 34.1 g/dL (ref 31.5–35.7)
MCV: 86 fL (ref 79–97)
PLATELETS: 173 10*3/uL (ref 150–379)
RBC: 4.31 x10E6/uL (ref 3.77–5.28)
RDW: 13.1 % (ref 12.3–15.4)
RPR: NONREACTIVE
RUBELLA: 2.41 {index} (ref >0.99)
Rh Factor: POSITIVE
VARICELLA: 171 {index} (ref >165)
WBC: 6.5 10*3/uL (ref 3.4–10.8)

## 2017-07-09 ENCOUNTER — Telehealth: Payer: Self-pay

## 2017-07-09 NOTE — Telephone Encounter (Signed)
Pt called triage stating they only sold  tablets in B6 and wanted to know if it was safe to take the whole  or if she could half the dose. Pt aware she can half the pill, will f/u prn if needed

## 2017-07-18 ENCOUNTER — Encounter: Payer: BLUE CROSS/BLUE SHIELD | Admitting: Obstetrics and Gynecology

## 2017-07-29 ENCOUNTER — Ambulatory Visit (INDEPENDENT_AMBULATORY_CARE_PROVIDER_SITE_OTHER): Payer: BLUE CROSS/BLUE SHIELD | Admitting: Obstetrics & Gynecology

## 2017-07-29 VITALS — BP 110/70 | Wt 132.0 lb

## 2017-07-29 DIAGNOSIS — Z3481 Encounter for supervision of other normal pregnancy, first trimester: Secondary | ICD-10-CM

## 2017-07-29 DIAGNOSIS — Z3A12 12 weeks gestation of pregnancy: Secondary | ICD-10-CM

## 2017-07-29 MED ORDER — PRENATE MINI 18-0.6-0.4-350 MG PO CAPS: 1.0000 | ORAL_CAPSULE | Freq: Every day | ORAL | 11 refills | Status: DC

## 2017-07-29 NOTE — Progress Notes (Signed)
  Subjective  Fetal Movement? no Contractions? no Leaking Fluid? no Vaginal Bleeding? no Min nausea Objective  BP 110/70   Wt 132 lb (59.9 kg)   LMP 05/02/2017 (Exact Date)   BMI 23.38 kg/m  General: NAD Pumonary: no increased work of breathing Abdomen: gravid, non-tender Extremities: no edema Psychiatric: mood appropriate, affect full  Assessment  22 y.o. G2P1001 at 417w4d by  02/06/2018, by Last Menstrual Period presenting for routine prenatal visit  Plan   Problem List Items Addressed This Visit      Other   Supervision of normal pregnancy    Other Visit Diagnoses    [redacted] weeks gestation of pregnancy    -  Primary    Genetic counseling and testing offered, declined  Annamarie MajorPaul Zoey Bidwell, MD, Merlinda FrederickFACOG [facility], [address] [address] [facility] 07/29/2017  11:39 AM

## 2017-08-23 ENCOUNTER — Encounter: Payer: Self-pay | Admitting: Advanced Practice Midwife

## 2017-08-23 ENCOUNTER — Ambulatory Visit (INDEPENDENT_AMBULATORY_CARE_PROVIDER_SITE_OTHER): Payer: BLUE CROSS/BLUE SHIELD | Admitting: Advanced Practice Midwife

## 2017-08-23 VITALS — BP 104/64 | Wt 135.0 lb

## 2017-08-23 DIAGNOSIS — Z3A16 16 weeks gestation of pregnancy: Secondary | ICD-10-CM

## 2017-08-23 DIAGNOSIS — Z3482 Encounter for supervision of other normal pregnancy, second trimester: Secondary | ICD-10-CM

## 2017-08-23 DIAGNOSIS — Z3480 Encounter for supervision of other normal pregnancy, unspecified trimester: Secondary | ICD-10-CM

## 2017-08-23 NOTE — Progress Notes (Signed)
  Routine Prenatal Care Visit  Subjective  Emily Mosley is a 22 y.o. G2P1001 at 445w1d being seen today for ongoing prenatal care.  She is currently monitored for the following issues for this low-risk pregnancy and has Supervision of normal pregnancy on their problem list.  ----------------------------------------------------------------------------------- Patient reports no complaints.  She has questions regarding timing of delivery given her history of shoulder dystocia. Discussion of evaluation throughout the pregnancy including fundal height, growth scans to determine optimal timing of delivery.   . Vag. Bleeding: None.   . Denies leaking of fluid.  ----------------------------------------------------------------------------------- The following portions of the patient's history were reviewed and updated as appropriate: allergies, current medications, past family history, past medical history, past social history, past surgical history and problem list. Problem list updated.   Objective  Blood pressure 104/64, weight 135 lb (61.2 kg), last menstrual period 05/02/2017. Pregravid weight 132 lb (59.9 kg) Total Weight Gain 3 lb (1.361 kg) Urinalysis:      Fetal Status: Fetal Heart Rate (bpm): 150         General:  Alert, oriented and cooperative. Patient is in no acute distress.  Skin: Skin is warm and dry. No rash noted.   Cardiovascular: Normal heart rate noted  Respiratory: Normal respiratory effort, no problems with respiration noted  Abdomen: Soft, gravid, appropriate for gestational age.       Pelvic:  Cervical exam deferred        Extremities: Normal range of motion.  Edema: None  Mental Status: Normal mood and affect. Normal behavior. Normal judgment and thought content.   Assessment   22 y.o. G2P1001 at 235w1d by  02/06/2018, by Last Menstrual Period presenting for routine prenatal visit  Plan   SECOND Problems (from 06/10/17 to present)    Problem Noted Resolved     Supervision of normal pregnancy 06/10/2017 by Oswaldo ConroySchmid, Jacelyn Y, CNM No   Overview Signed 06/10/2017  6:56 PM by Oswaldo ConroySchmid, Jacelyn Y, CNM    [facility] Prenatal [facility]  Dating  Blood type:     Genetic Screen 1 Screen:    AFP:     Quad:     NIPS: Antibody:   Anatomic US  Rubella:   Varicella:    GTT Early:               Third trimester:  RPR:     Rhogam  HBsAg:     TDaP vaccine                       Flu Shot: HIV:     Baby Food                                GBS:   Contraception  Pap:  CBB     CS/VBAC    Support Person                  Preterm labor symptoms and general obstetric precautions including but not limited to vaginal bleeding, contractions, leaking of fluid and fetal movement were reviewed in detail with the patient.   Return in about 3 weeks (around 09/12/2017) for anatomy scan and rob.  Tresea MallJane Carmin Alvidrez, CNM 08/23/2017 1:55 PM

## 2017-08-23 NOTE — Progress Notes (Signed)
No complaints

## 2017-08-26 ENCOUNTER — Encounter: Payer: BLUE CROSS/BLUE SHIELD | Admitting: Obstetrics and Gynecology

## 2017-09-16 ENCOUNTER — Other Ambulatory Visit: Payer: BLUE CROSS/BLUE SHIELD

## 2017-09-16 ENCOUNTER — Encounter: Payer: BLUE CROSS/BLUE SHIELD | Admitting: Obstetrics and Gynecology

## 2017-09-19 ENCOUNTER — Encounter: Payer: Self-pay | Admitting: Maternal Newborn

## 2017-09-19 ENCOUNTER — Ambulatory Visit (INDEPENDENT_AMBULATORY_CARE_PROVIDER_SITE_OTHER): Payer: BLUE CROSS/BLUE SHIELD | Admitting: Maternal Newborn

## 2017-09-19 ENCOUNTER — Ambulatory Visit (INDEPENDENT_AMBULATORY_CARE_PROVIDER_SITE_OTHER): Payer: BLUE CROSS/BLUE SHIELD

## 2017-09-19 VITALS — BP 100/60 | Wt 135.0 lb

## 2017-09-19 DIAGNOSIS — Z363 Encounter for antenatal screening for malformations: Secondary | ICD-10-CM | POA: Diagnosis not present

## 2017-09-19 DIAGNOSIS — Z3480 Encounter for supervision of other normal pregnancy, unspecified trimester: Secondary | ICD-10-CM

## 2017-09-19 DIAGNOSIS — Z3A20 20 weeks gestation of pregnancy: Secondary | ICD-10-CM

## 2017-09-19 DIAGNOSIS — Z3482 Encounter for supervision of other normal pregnancy, second trimester: Secondary | ICD-10-CM

## 2017-09-19 DIAGNOSIS — Z3689 Encounter for other specified antenatal screening: Secondary | ICD-10-CM

## 2017-09-19 LAB — POCT URINALYSIS DIPSTICK OB
GLUCOSE, UA: NEGATIVE — AB
POC,PROTEIN,UA: NEGATIVE

## 2017-09-19 NOTE — Progress Notes (Signed)
No concerns.rj 

## 2017-09-19 NOTE — Patient Instructions (Signed)

## 2017-09-19 NOTE — Progress Notes (Signed)
    Routine Prenatal Care Visit  Subjective  Emily Mosley is a 22 Mosley.o. G2P1001 at 4243w0d being seen today for ongoing prenatal care.  She is currently monitored for the following issues for this low-risk pregnancy and has Supervision of normal pregnancy on their problem list.  ----------------------------------------------------------------------------------- Patient reports no complaints.   Contractions: Not present. Vag. Bleeding: None.  Movement: Present. No leaking of fluid.  ----------------------------------------------------------------------------------- The following portions of the patient's history were reviewed and updated as appropriate: allergies, current medications, past family history, past medical history, past social history, past surgical history and problem list. Problem list updated.  Objective  Blood pressure 100/60, weight 135 lb (61.2 kg), last menstrual period 05/02/2017. Pregravid weight 132 lb (59.9 kg) Total Weight Gain 3 lb (1.361 kg) Urinalysis: Glucose Negative; Protein Negative Fetal Status: Fetal Heart Rate (bpm): 137 Fundal Height: 20 cm Movement: Present     General:  Alert, oriented and cooperative. Patient is in no acute distress.  Skin: Skin is warm and dry. No rash noted.   Cardiovascular: Normal heart rate noted  Respiratory: Normal respiratory effort, no problems with respiration noted  Abdomen: Soft, gravid, appropriate for gestational age. Pain/Pressure: Absent     Pelvic:  Cervical exam deferred        Extremities: Normal range of motion.  Edema: None  Mental Status: Normal mood and affect. Normal behavior. Normal judgment and thought content.     Assessment   22 Mosley.o. G2P1001 at 5043w0d, EDD 02/06/2018 by Last Menstrual Period presenting for routine prenatal visit.  Plan   SECOND Problems (from 06/10/17 to present)    Problem Noted Resolved   Supervision of normal pregnancy 06/10/2017 by Emily Mosley, Emily Mosley, CNM No   Overview  Signed 06/10/2017  6:56 PM by Emily Mosley, Emily Mosley, CNM    [facility] Prenatal [facility]  Dating  Blood type:     Genetic Screen 1 Screen:    AFP:     Quad:     NIPS: Antibody:   Anatomic US  Rubella:   Varicella:    GTT Early:               Third trimester:  RPR:     Rhogam  HBsAg:     TDaP vaccine                       Flu Shot: HIV:     Baby Food                                GBS:   Contraception  Pap:  CBB     CS/VBAC    Support Person               Anatomy scan today normal but incomplete for cardiac views.  Samples given for various prenatal vitamins today; her current pills have an unpleasant taste.  Gestational age appropriate obstetric precautions were reviewed.  Please refer to After Visit Summary for other counseling recommendations.   Return in about 4 weeks (around 10/17/2017) for ROB and follow up anatomy scan.  Emily Mosley, CNM 09/19/2017  10:39 AM

## 2017-10-17 ENCOUNTER — Encounter: Payer: Self-pay | Admitting: Advanced Practice Midwife

## 2017-10-17 ENCOUNTER — Ambulatory Visit (INDEPENDENT_AMBULATORY_CARE_PROVIDER_SITE_OTHER): Payer: BLUE CROSS/BLUE SHIELD | Admitting: Advanced Practice Midwife

## 2017-10-17 ENCOUNTER — Ambulatory Visit (INDEPENDENT_AMBULATORY_CARE_PROVIDER_SITE_OTHER): Payer: BLUE CROSS/BLUE SHIELD

## 2017-10-17 VITALS — BP 100/60 | Wt 141.0 lb

## 2017-10-17 DIAGNOSIS — Z3A24 24 weeks gestation of pregnancy: Secondary | ICD-10-CM | POA: Diagnosis not present

## 2017-10-17 DIAGNOSIS — Z113 Encounter for screening for infections with a predominantly sexual mode of transmission: Secondary | ICD-10-CM

## 2017-10-17 DIAGNOSIS — Z3482 Encounter for supervision of other normal pregnancy, second trimester: Secondary | ICD-10-CM

## 2017-10-17 DIAGNOSIS — Z131 Encounter for screening for diabetes mellitus: Secondary | ICD-10-CM

## 2017-10-17 DIAGNOSIS — Z3480 Encounter for supervision of other normal pregnancy, unspecified trimester: Secondary | ICD-10-CM

## 2017-10-17 DIAGNOSIS — Z3689 Encounter for other specified antenatal screening: Secondary | ICD-10-CM

## 2017-10-17 DIAGNOSIS — Z130 Encounter for screening for diseases of the blood and blood-forming organs and certain disorders involving the immune mechanism: Secondary | ICD-10-CM

## 2017-10-17 LAB — POCT URINALYSIS DIPSTICK OB
Glucose, UA: NEGATIVE
POC,PROTEIN,UA: NEGATIVE

## 2017-10-17 NOTE — Progress Notes (Signed)
ROB U/S today/IT IS A BOY!!

## 2017-10-17 NOTE — Progress Notes (Signed)
  Routine Prenatal Care Visit  Subjective  Kimmie Founds Reuben Likes is a 22 y.o. G2P1001 at [redacted]w[redacted]d being seen today for ongoing prenatal care.  She is currently monitored for the following issues for this low-risk pregnancy and has Supervision of normal pregnancy on their problem list.  ----------------------------------------------------------------------------------- Patient reports no complaints.   Contractions: Not present. Vag. Bleeding: None.  Movement: Present. Denies leaking of fluid.  ----------------------------------------------------------------------------------- The following portions of the patient's history were reviewed and updated as appropriate: allergies, current medications, past family history, past medical history, past social history, past surgical history and problem list. Problem list updated.   Objective  Blood pressure 100/60, weight 141 lb (64 kg), last menstrual period 05/02/2017. Pregravid weight 132 lb (59.9 kg) Total Weight Gain 9 lb (4.082 kg) Urinalysis: Urine Protein Negative  Urine Glucose Negative  Fetal Status: Fetal Heart Rate (bpm): 149 Fundal Height: 25 cm Movement: Present     Follow up anatomy scan today: complete, normal, cephalic  General:  Alert, oriented and cooperative. Patient is in no acute distress.  Skin: Skin is warm and dry. No rash noted.   Cardiovascular: Normal heart rate noted  Respiratory: Normal respiratory effort, no problems with respiration noted  Abdomen: Soft, gravid, appropriate for gestational age. Pain/Pressure: Absent     Pelvic:  Cervical exam deferred        Extremities: Normal range of motion.     Mental Status: Normal mood and affect. Normal behavior. Normal judgment and thought content.   Assessment   22 y.o. G2P1001 at [redacted]w[redacted]d by  02/06/2018, by Last Menstrual Period presenting for routine prenatal visit  Plan   SECOND Problems (from 06/10/17 to present)    Problem Noted Resolved   Supervision of normal  pregnancy 06/10/2017 by Oswaldo Conroy, CNM No   Overview Signed 06/10/2017  6:56 PM by Oswaldo Conroy, CNM    [facility] Prenatal [facility]  Dating  Blood type:     Genetic Screen 1 Screen:    AFP:     Quad:     NIPS: Antibody:   Anatomic [place]  Rubella:   Varicella:    GTT Early:               Third trimester:  RPR:     Rhogam  HBsAg:     TDaP vaccine                       Flu Shot: HIV:     Baby Food                                GBS:   Contraception  Pap:  CBB     CS/VBAC    Support Person                  Preterm labor symptoms and general obstetric precautions including but not limited to vaginal bleeding, contractions, leaking of fluid and fetal movement were reviewed in detail with the patient.    Return in about 4 weeks (around 11/14/2017) for 28 week labs and rob.  Tresea Mall, CNM 10/17/2017 10:37 AM

## 2017-11-14 ENCOUNTER — Ambulatory Visit (INDEPENDENT_AMBULATORY_CARE_PROVIDER_SITE_OTHER): Payer: BLUE CROSS/BLUE SHIELD | Admitting: Maternal Newborn

## 2017-11-14 ENCOUNTER — Other Ambulatory Visit: Payer: BLUE CROSS/BLUE SHIELD

## 2017-11-14 VITALS — BP 108/58 | Wt 148.5 lb

## 2017-11-14 DIAGNOSIS — Z130 Encounter for screening for diseases of the blood and blood-forming organs and certain disorders involving the immune mechanism: Secondary | ICD-10-CM

## 2017-11-14 DIAGNOSIS — Z131 Encounter for screening for diabetes mellitus: Secondary | ICD-10-CM

## 2017-11-14 DIAGNOSIS — Z3480 Encounter for supervision of other normal pregnancy, unspecified trimester: Secondary | ICD-10-CM

## 2017-11-14 DIAGNOSIS — Z3483 Encounter for supervision of other normal pregnancy, third trimester: Secondary | ICD-10-CM

## 2017-11-14 DIAGNOSIS — Z3A28 28 weeks gestation of pregnancy: Secondary | ICD-10-CM

## 2017-11-14 DIAGNOSIS — Z113 Encounter for screening for infections with a predominantly sexual mode of transmission: Secondary | ICD-10-CM

## 2017-11-14 LAB — POCT URINALYSIS DIPSTICK OB
GLUCOSE, UA: NEGATIVE
POC,PROTEIN,UA: NEGATIVE

## 2017-11-14 NOTE — Progress Notes (Signed)
ROB/GTT NO concerns or problems

## 2017-11-14 NOTE — Progress Notes (Signed)
    Routine Prenatal Care Visit  Subjective  Emily Mosley is a 22 y.o. G2P1001 at [redacted]w[redacted]d being seen today for ongoing prenatal care.  She is currently monitored for the following issues for this low-risk pregnancy and has Supervision of normal pregnancy on their problem list.  ----------------------------------------------------------------------------------- Patient reports no complaints.   Contractions: Not present. Vag. Bleeding: None.  Movement: Present. No leaking of fluid.  ----------------------------------------------------------------------------------- The following portions of the patient's history were reviewed and updated as appropriate: allergies, current medications, past family history, past medical history, past social history, past surgical history and problem list. Problem list updated.   Objective  Blood pressure (!) 108/58, weight 148 lb 8 oz (67.4 kg), last menstrual period 05/02/2017. Pregravid weight 132 lb (59.9 kg) Total Weight Gain 16 lb 8 oz (7.484 kg) Body mass index is 26.31 kg/m.   Urinalysis: Protein Negative, Glucose Negative Fetal Status: Fetal Heart Rate (bpm): 134 Fundal Height: 29 cm Movement: Present     General:  Alert, oriented and cooperative. Patient is in no acute distress.  Skin: Skin is warm and dry. No rash noted.   Cardiovascular: Normal heart rate noted  Respiratory: Normal respiratory effort, no problems with respiration noted  Abdomen: Soft, gravid, appropriate for gestational age. Pain/Pressure: Absent     Pelvic:  Cervical exam deferred        Extremities: Normal range of motion.     Mental Status: Normal mood and affect. Normal behavior. Normal judgment and thought content.     Assessment   22 y.o. G2P1001 at [redacted]w[redacted]d,  EDD 02/06/2018 by Last Menstrual Period presenting for a routine prenatal visit.  Plan   SECOND Problems (from 06/10/17 to present)    Problem Noted Resolved   Supervision of normal pregnancy 06/10/2017  by Oswaldo Conroy, CNM No   Overview Signed 06/10/2017  6:56 PM by Oswaldo Conroy, CNM    [facility] Prenatal Labs  Dating  Blood type:     Genetic Screen 1 Screen:    AFP:     Quad:     NIPS: Antibody:   Anatomic [place]  Rubella:   Varicella:    GTT Early:               Third trimester:  RPR:     Rhogam  HBsAg:     TDaP vaccine                       Flu Shot: HIV:     Baby Food                                GBS:   Contraception  Pap:  CBB     CS/VBAC    Support Person               28 week labs/GTT today.   Gestational age appropriate obstetric precautions were reviewed.  Please refer to After Visit Summary for other counseling recommendations.   Return in about 2 weeks (around 11/28/2017) for ROB.  Marcelyn Bruins, CNM 11/14/2017  10:30 AM

## 2017-11-15 LAB — 28 WEEK RH+PANEL
BASOS: 0 %
Basophils Absolute: 0 10*3/uL (ref 0.0–0.2)
EOS (ABSOLUTE): 0.1 10*3/uL (ref 0.0–0.4)
Eos: 1 %
Gestational Diabetes Screen: 56 mg/dL — ABNORMAL LOW (ref 65–139)
HEMOGLOBIN: 11 g/dL — AB (ref 11.1–15.9)
HIV SCREEN 4TH GENERATION: NONREACTIVE
Hematocrit: 32 % — ABNORMAL LOW (ref 34.0–46.6)
Immature Grans (Abs): 0.1 10*3/uL (ref 0.0–0.1)
Immature Granulocytes: 1 %
LYMPHS: 20 %
Lymphocytes Absolute: 1.9 10*3/uL (ref 0.7–3.1)
MCH: 29.3 pg (ref 26.6–33.0)
MCHC: 34.4 g/dL (ref 31.5–35.7)
MCV: 85 fL (ref 79–97)
MONOCYTES: 7 %
Monocytes Absolute: 0.6 10*3/uL (ref 0.1–0.9)
NEUTROS ABS: 6.7 10*3/uL (ref 1.4–7.0)
Neutrophils: 71 %
Platelets: 180 10*3/uL (ref 150–450)
RBC: 3.75 x10E6/uL — AB (ref 3.77–5.28)
RDW: 12.2 % — ABNORMAL LOW (ref 12.3–15.4)
RPR Ser Ql: NONREACTIVE
WBC: 9.5 10*3/uL (ref 3.4–10.8)

## 2017-11-18 ENCOUNTER — Encounter: Payer: Self-pay | Admitting: Maternal Newborn

## 2017-11-18 NOTE — Patient Instructions (Signed)
Third Trimester of Pregnancy The third trimester is from week 28 through week 40 (months 7 through 9). The third trimester is a time when the unborn baby (fetus) is growing rapidly. At the end of the ninth month, the fetus is about 20 inches in length and weighs 6-10 pounds. Body changes during your third trimester Your body will continue to go through many changes during pregnancy. The changes vary from woman to woman. During the third trimester:  Your weight will continue to increase. You can expect to gain 25-35 pounds (11-16 kg) by the end of the pregnancy.  You may begin to get stretch marks on your hips, abdomen, and breasts.  You may urinate more often because the fetus is moving lower into your pelvis and pressing on your bladder.  You may develop or continue to have heartburn. This is caused by increased hormones that slow down muscles in the digestive tract.  You may develop or continue to have constipation because increased hormones slow digestion and cause the muscles that push waste through your intestines to relax.  You may develop hemorrhoids. These are swollen veins (varicose veins) in the rectum that can itch or be painful.  You may develop swollen, bulging veins (varicose veins) in your legs.  You may have increased body aches in the pelvis, back, or thighs. This is due to weight gain and increased hormones that are relaxing your joints.  You may have changes in your hair. These can include thickening of your hair, rapid growth, and changes in texture. Some women also have hair loss during or after pregnancy, or hair that feels dry or thin. Your hair will most likely return to normal after your baby is born.  Your breasts will continue to grow and they will continue to become tender. A yellow fluid (colostrum) may leak from your breasts. This is the first milk you are producing for your baby.  Your belly button may stick out.  You may notice more swelling in your hands,  face, or ankles.  You may have increased tingling or numbness in your hands, arms, and legs. The skin on your belly may also feel numb.  You may feel short of breath because of your expanding uterus.  You may have more problems sleeping. This can be caused by the size of your belly, increased need to urinate, and an increase in your body's metabolism.  You may notice the fetus "dropping," or moving lower in your abdomen (lightening).  You may have increased vaginal discharge.  You may notice your joints feel loose and you may have pain around your pelvic bone.  What to expect at prenatal visits You will have prenatal exams every 2 weeks until week 36. Then you will have weekly prenatal exams. During a routine prenatal visit:  You will be weighed to make sure you and the baby are growing normally.  Your blood pressure will be taken.  Your abdomen will be measured to track your baby's growth.  The fetal heartbeat will be listened to.  Any test results from the previous visit will be discussed.  You may have a cervical check near your due date to see if your cervix has softened or thinned (effaced).  You will be tested for Group B streptococcus. This happens between 35 and 37 weeks.  Your health care provider may ask you:  What your birth plan is.  How you are feeling.  If you are feeling the baby move.  If you have had   any abnormal symptoms, such as leaking fluid, bleeding, severe headaches, or abdominal cramping.  If you are using any tobacco products, including cigarettes, chewing tobacco, and electronic cigarettes.  If you have any questions.  Other tests or screenings that may be performed during your third trimester include:  Blood tests that check for low iron levels (anemia).  Fetal testing to check the health, activity level, and growth of the fetus. Testing is done if you have certain medical conditions or if there are problems during the  pregnancy.  Nonstress test (NST). This test checks the health of your baby to make sure there are no signs of problems, such as the baby not getting enough oxygen. During this test, a belt is placed around your belly. The baby is made to move, and its heart rate is monitored during movement.  What is false labor? False labor is a condition in which you feel small, irregular tightenings of the muscles in the womb (contractions) that usually go away with rest, changing position, or drinking water. These are called Braxton Hicks contractions. Contractions may last for hours, days, or even weeks before true labor sets in. If contractions come at regular intervals, become more frequent, increase in intensity, or become painful, you should see your health care provider. What are the signs of labor?  Abdominal cramps.  Regular contractions that start at 10 minutes apart and become stronger and more frequent with time.  Contractions that start on the top of the uterus and spread down to the lower abdomen and back.  Increased pelvic pressure and dull back pain.  A watery or bloody mucus discharge that comes from the vagina.  Leaking of amniotic fluid. This is also known as your "water breaking." It could be a slow trickle or a gush. Let your health care provider know if it has a color or strange odor. If you have any of these signs, call your health care provider right away, even if it is before your due date. Follow these instructions at home: Medicines  Follow your health care provider's instructions regarding medicine use. Specific medicines may be either safe or unsafe to take during pregnancy.  Take a prenatal vitamin that contains at least 600 micrograms (mcg) of folic acid.  If you develop constipation, try taking a stool softener if your health care provider approves. Eating and drinking  Eat a balanced diet that includes fresh fruits and vegetables, whole grains, good sources of protein  such as meat, eggs, or tofu, and low-fat dairy. Your health care provider will help you determine the amount of weight gain that is right for you.  Avoid raw meat and uncooked cheese. These carry germs that can cause birth defects in the baby.  If you have low calcium intake from food, talk to your health care provider about whether you should take a daily calcium supplement.  Eat four or five small meals rather than three large meals a day.  Limit foods that are high in fat and processed sugars, such as fried and sweet foods.  To prevent constipation: ? Drink enough fluid to keep your urine clear or pale yellow. ? Eat foods that are high in fiber, such as fresh fruits and vegetables, whole grains, and beans. Activity  Exercise only as directed by your health care provider. Most women can continue their usual exercise routine during pregnancy. Try to exercise for 30 minutes at least 5 days a week. Stop exercising if you experience uterine contractions.  Avoid heavy   lifting.  Do not exercise in extreme heat or humidity, or at high altitudes.  Wear low-heel, comfortable shoes.  Practice good posture.  You may continue to have sex unless your health care provider tells you otherwise. Relieving pain and discomfort  Take frequent breaks and rest with your legs elevated if you have leg cramps or low back pain.  Take warm sitz baths to soothe any pain or discomfort caused by hemorrhoids. Use hemorrhoid cream if your health care provider approves.  Wear a good support bra to prevent discomfort from breast tenderness.  If you develop varicose veins: ? Wear support pantyhose or compression stockings as told by your healthcare provider. ? Elevate your feet for 15 minutes, 3-4 times a day. Prenatal care  Write down your questions. Take them to your prenatal visits.  Keep all your prenatal visits as told by your health care provider. This is important. Safety  Wear your seat belt at  all times when driving.  Make a list of emergency phone numbers, including numbers for family, friends, the hospital, and police and fire departments. General instructions  Avoid cat litter boxes and soil used by cats. These carry germs that can cause birth defects in the baby. If you have a cat, ask someone to clean the litter box for you.  Do not travel far distances unless it is absolutely necessary and only with the approval of your health care provider.  Do not use hot tubs, steam rooms, or saunas.  Do not drink alcohol.  Do not use any products that contain nicotine or tobacco, such as cigarettes and e-cigarettes. If you need help quitting, ask your health care provider.  Do not use any medicinal herbs or unprescribed drugs. These chemicals affect the formation and growth of the baby.  Do not douche or use tampons or scented sanitary pads.  Do not cross your legs for long periods of time.  To prepare for the arrival of your baby: ? Take prenatal classes to understand, practice, and ask questions about labor and delivery. ? Make a trial run to the hospital. ? Visit the hospital and tour the maternity area. ? Arrange for maternity or paternity leave through employers. ? Arrange for family and friends to take care of pets while you are in the hospital. ? Purchase a rear-facing car seat and make sure you know how to install it in your car. ? Pack your hospital bag. ? Prepare the baby's nursery. Make sure to remove all pillows and stuffed animals from the baby's crib to prevent suffocation.  Visit your dentist if you have not gone during your pregnancy. Use a soft toothbrush to brush your teeth and be gentle when you floss. Contact a health care provider if:  You are unsure if you are in labor or if your water has broken.  You become dizzy.  You have mild pelvic cramps, pelvic pressure, or nagging pain in your abdominal area.  You have lower back pain.  You have persistent  nausea, vomiting, or diarrhea.  You have an unusual or bad smelling vaginal discharge.  You have pain when you urinate. Get help right away if:  Your water breaks before 37 weeks.  You have regular contractions less than 5 minutes apart before 37 weeks.  You have a fever.  You are leaking fluid from your vagina.  You have spotting or bleeding from your vagina.  You have severe abdominal pain or cramping.  You have rapid weight loss or weight gain.    You have shortness of breath with chest pain.  You notice sudden or extreme swelling of your face, hands, ankles, feet, or legs.  Your baby makes fewer than 10 movements in 2 hours.  You have severe headaches that do not go away when you take medicine.  You have vision changes. Summary  The third trimester is from week 28 through week 40, months 7 through 9. The third trimester is a time when the unborn baby (fetus) is growing rapidly.  During the third trimester, your discomfort may increase as you and your baby continue to gain weight. You may have abdominal, leg, and back pain, sleeping problems, and an increased need to urinate.  During the third trimester your breasts will keep growing and they will continue to become tender. A yellow fluid (colostrum) may leak from your breasts. This is the first milk you are producing for your baby.  False labor is a condition in which you feel small, irregular tightenings of the muscles in the womb (contractions) that eventually go away. These are called Braxton Hicks contractions. Contractions may last for hours, days, or even weeks before true labor sets in.  Signs of labor can include: abdominal cramps; regular contractions that start at 10 minutes apart and become stronger and more frequent with time; watery or bloody mucus discharge that comes from the vagina; increased pelvic pressure and dull back pain; and leaking of amniotic fluid. This information is not intended to replace advice  given to you by your health care provider. Make sure you discuss any questions you have with your health care provider. Document Released: 01/23/2001 Document Revised: 07/07/2015 Document Reviewed: 04/01/2012 Elsevier Interactive Patient Education  2017 [employer]  

## 2017-11-28 ENCOUNTER — Telehealth: Payer: Self-pay | Admitting: Maternal Newborn

## 2017-11-28 ENCOUNTER — Ambulatory Visit (INDEPENDENT_AMBULATORY_CARE_PROVIDER_SITE_OTHER): Payer: BLUE CROSS/BLUE SHIELD | Admitting: Maternal Newborn

## 2017-11-28 ENCOUNTER — Encounter: Payer: Self-pay | Admitting: Maternal Newborn

## 2017-11-28 VITALS — BP 100/60 | Wt 150.0 lb

## 2017-11-28 DIAGNOSIS — O9989 Other specified diseases and conditions complicating pregnancy, childbirth and the puerperium: Secondary | ICD-10-CM

## 2017-11-28 DIAGNOSIS — Z3480 Encounter for supervision of other normal pregnancy, unspecified trimester: Secondary | ICD-10-CM

## 2017-11-28 DIAGNOSIS — Z3A30 30 weeks gestation of pregnancy: Secondary | ICD-10-CM

## 2017-11-28 DIAGNOSIS — O322XX0 Maternal care for transverse and oblique lie, not applicable or unspecified: Secondary | ICD-10-CM

## 2017-11-28 DIAGNOSIS — R2231 Localized swelling, mass and lump, right upper limb: Secondary | ICD-10-CM

## 2017-11-28 LAB — POCT URINALYSIS DIPSTICK OB
Glucose, UA: NEGATIVE
POC,PROTEIN,UA: NEGATIVE

## 2017-11-28 NOTE — Progress Notes (Signed)
    Routine Prenatal Care Visit  Subjective  Emily Mosley is a 22 y.o. G2P1001 at [redacted]w[redacted]d being seen today for ongoing prenatal care.  She is currently monitored for the following issues for this low-risk pregnancy and has Supervision of normal pregnancy on their problem list.  ----------------------------------------------------------------------------------- Patient reports a tender lump in her right armpit that has been present for a while.   Contractions: Not present. Vag. Bleeding: None.  Movement: Present. No leaking of fluid.  ----------------------------------------------------------------------------------- The following portions of the patient's history were reviewed and updated as appropriate: allergies, current medications, past family history, past medical history, past social history, past surgical history and problem list. Problem list updated.   Objective  Blood pressure 100/60, weight 150 lb (68 kg), last menstrual period 05/02/2017. Pregravid weight 132 lb (59.9 kg) Total Weight Gain 18 lb (8.165 kg) Body mass index is 26.57 kg/m. Urinalysis: Protein Negative, Glucose Negative  Fetal Status: Fetal Heart Rate (bpm): 138 Fundal Height: 31 cm Movement: Present  Presentation: Transverse  General:  Alert, oriented and cooperative. Patient is in no acute distress.  Skin: Skin is warm and dry. No rash noted.   Cardiovascular: Normal heart rate noted  Respiratory: Normal respiratory effort, no problems with respiration noted  Abdomen: Soft, gravid, appropriate for gestational age. Pain/Pressure: Absent     Pelvic:  Cervical exam deferred        Extremities: Normal range of motion.  Edema: None  Mental Status: Normal mood and affect. Normal behavior. Normal judgment and thought content.     Assessment   22 y.o. G2P1001 at [redacted]w[redacted]d, EDD 02/06/2018 by Last Menstrual Period presenting for a routine prenatal visit.  Plan   SECOND Problems (from 06/10/17 to present)    Problem Noted Resolved   Supervision of normal pregnancy 06/10/2017 by Oswaldo Conroy, CNM No   Overview Signed 06/10/2017  6:56 PM by Oswaldo Conroy, CNM    [facility] Prenatal Labs  Dating  Blood type:     Genetic Screen 1 Screen:    AFP:     Quad:     NIPS: Antibody:   Anatomic [place]  Rubella:   Varicella:    GTT Early:               Third trimester:  RPR:     Rhogam  HBsAg:     TDaP vaccine                       Flu Shot: HIV:     Baby Food                                GBS:   Contraception  Pap:  CBB     CS/VBAC    Support Person               Transverse lie on palpation today, order ultrasound next visit if baby does not spontaenously change to vertex.  Ultrasound for axillary lump was ordered today.  TDaP declined as she reports that she has had the vaccine this year.  Gestational age appropriate obstetric precautions were reviewed.  Return in about 2 weeks (around 12/12/2017) for ROB.  Marcelyn Bruins, CNM 11/28/2017  11:24 AM

## 2017-11-28 NOTE — Telephone Encounter (Signed)
No answer, vm not set up.

## 2017-11-29 DIAGNOSIS — O322XX0 Maternal care for transverse and oblique lie, not applicable or unspecified: Secondary | ICD-10-CM | POA: Insufficient documentation

## 2017-12-02 NOTE — Telephone Encounter (Signed)
No answer, vm not set up.

## 2017-12-03 ENCOUNTER — Ambulatory Visit
Admission: RE | Admit: 2017-12-03 | Discharge: 2017-12-03 | Disposition: A | Discharge status: Home / Self Care | Payer: BLUE CROSS/BLUE SHIELD | Source: Ambulatory Visit | Attending: Maternal Newborn | Admitting: Maternal Newborn

## 2017-12-03 DIAGNOSIS — R2231 Localized swelling, mass and lump, right upper limb: Secondary | ICD-10-CM | POA: Insufficient documentation

## 2017-12-03 NOTE — Telephone Encounter (Signed)
No answer, vm not set up.

## 2017-12-03 NOTE — Telephone Encounter (Signed)
No answer, v/m not set up on mobile#. Lmtrc on home#.

## 2017-12-04 ENCOUNTER — Ambulatory Visit: Payer: BLUE CROSS/BLUE SHIELD

## 2017-12-11 ENCOUNTER — Encounter: Payer: Self-pay | Admitting: Advanced Practice Midwife

## 2017-12-11 ENCOUNTER — Ambulatory Visit (INDEPENDENT_AMBULATORY_CARE_PROVIDER_SITE_OTHER): Payer: BLUE CROSS/BLUE SHIELD | Admitting: Advanced Practice Midwife

## 2017-12-11 VITALS — BP 114/70 | Wt 152.0 lb

## 2017-12-11 DIAGNOSIS — Z3A31 31 weeks gestation of pregnancy: Secondary | ICD-10-CM

## 2017-12-11 DIAGNOSIS — Z3483 Encounter for supervision of other normal pregnancy, third trimester: Secondary | ICD-10-CM

## 2017-12-11 DIAGNOSIS — O09293 Supervision of pregnancy with other poor reproductive or obstetric history, third trimester: Secondary | ICD-10-CM | POA: Insufficient documentation

## 2017-12-11 NOTE — Progress Notes (Signed)
No vb. No lof.  

## 2017-12-11 NOTE — Progress Notes (Signed)
  Routine Prenatal Care Visit  Subjective  Emily Mosley is a 22 y.o. G2P1001 at [redacted]w[redacted]d being seen today for ongoing prenatal care.  She is currently monitored for the following issues for this high-risk pregnancy and has Supervision of normal pregnancy and Transverse fetal lie on examination on their problem list.  ----------------------------------------------------------------------------------- Patient reports no complaints. Discussion of growth and presentation scan at 36 weeks and possibility of induction at 39 weeks to prevent shoulder dystocia.  Contractions: Not present. Vag. Bleeding: None.  Movement: Present. Denies leaking of fluid.  ----------------------------------------------------------------------------------- The following portions of the patient's history were reviewed and updated as appropriate: allergies, current medications, past family history, past medical history, past social history, past surgical history and problem list. Problem list updated.   Objective  Blood pressure 114/70, weight 152 lb (68.9 kg), last menstrual period 05/02/2017. Pregravid weight 132 lb (59.9 kg) Total Weight Gain 20 lb (9.072 kg) Urinalysis: Urine Protein    Urine Glucose    Fetal Status: Fetal Heart Rate (bpm): 150 Fundal Height: 32 cm Movement: Present     Vertex by [place]   General:  Alert, oriented and cooperative. Patient is in no acute distress.  Skin: Skin is warm and dry. No rash noted.   Cardiovascular: Normal heart rate noted  Respiratory: Normal respiratory effort, no problems with respiration noted  Abdomen: Soft, gravid, appropriate for gestational age. Pain/Pressure: Absent     Pelvic:  Cervical exam deferred        Extremities: Normal range of motion.  Edema: None  Mental Status: Normal mood and affect. Normal behavior. Normal judgment and thought content.   Assessment   22 y.o. G2P1001 at [redacted]w[redacted]d by  02/06/2018, by Last Menstrual Period presenting for routine  prenatal visit  Plan   SECOND Problems (from 06/10/17 to present)    Problem Noted Resolved   Supervision of normal pregnancy 06/10/2017 by Oswaldo Conroy, CNM No   Overview Signed 06/10/2017  6:56 PM by Oswaldo Conroy, CNM    [facility] Prenatal Labs  Dating  Blood type:     Genetic Screen 1 Screen:    AFP:     Quad:     NIPS: Antibody:   Anatomic [place]  Rubella:   Varicella:    GTT Early:               Third trimester:  RPR:     Rhogam  HBsAg:     TDaP vaccine                       Flu Shot: HIV:     Baby Food                                GBS:   Contraception  Pap:  CBB     CS/VBAC    Support Person                  Preterm labor symptoms and general obstetric precautions including but not limited to vaginal bleeding, contractions, leaking of fluid and fetal movement were reviewed in detail with the patient.    Return in about 2 weeks (around 12/25/2017) for rob.   Plan for growth/presentation u/s at 36 wks  Tresea Mall, [facility] 12/11/2017 11:09 AM

## 2017-12-13 ENCOUNTER — Encounter: Payer: BLUE CROSS/BLUE SHIELD | Admitting: Maternal Newborn

## 2017-12-25 ENCOUNTER — Encounter: Payer: Self-pay | Admitting: Maternal Newborn

## 2017-12-25 ENCOUNTER — Ambulatory Visit (INDEPENDENT_AMBULATORY_CARE_PROVIDER_SITE_OTHER): Payer: BLUE CROSS/BLUE SHIELD | Admitting: Maternal Newborn

## 2017-12-25 VITALS — BP 110/60 | Wt 152.0 lb

## 2017-12-25 DIAGNOSIS — Z3689 Encounter for other specified antenatal screening: Secondary | ICD-10-CM

## 2017-12-25 DIAGNOSIS — Z3A33 33 weeks gestation of pregnancy: Secondary | ICD-10-CM

## 2017-12-25 DIAGNOSIS — Z3480 Encounter for supervision of other normal pregnancy, unspecified trimester: Secondary | ICD-10-CM

## 2017-12-25 LAB — POCT URINALYSIS DIPSTICK OB
GLUCOSE, UA: NEGATIVE
POC,PROTEIN,UA: NEGATIVE

## 2017-12-25 NOTE — Progress Notes (Signed)
    Routine Prenatal Care Visit  Subjective  Emily Mosley is a 22 y.o. G2P1001 at 6538w6d being seeEpifanio Mosley today for ongoing prenatal care.  She is currently monitored for the following issues for this low-risk pregnancy and has Supervision of normal pregnancy; Transverse fetal lie on examination; and History of shoulder dystocia in prior pregnancy, currently pregnant in third trimester on their problem list.  ----------------------------------------------------------------------------------- Patient reports that she is having more Braxton-Hicks contractions.   Contractions: Not present. Vag. Bleeding: None.  Movement: Present. No leaking of fluid.  ----------------------------------------------------------------------------------- The following portions of the patient's history were reviewed and updated as appropriate: allergies, current medications, past family history, past medical history, past social history, past surgical history and problem list. Problem list updated.  Objective  Blood pressure 110/60, weight 152 lb (68.9 kg), last menstrual period 05/02/2017. Pregravid weight 132 lb (59.9 kg) Total Weight Gain 20 lb (9.072 kg) Body mass index is 26.93 kg/m.   Urinalysis: Protein Negative, Glucose Negative  Fetal Status: Fetal Heart Rate (bpm): 136 Fundal Height: 34 cm Movement: Present     General:  Alert, oriented and cooperative. Patient is in no acute distress.  Skin: Skin is warm and dry. No rash noted.   Cardiovascular: Normal heart rate noted  Respiratory: Normal respiratory effort, no problems with respiration noted  Abdomen: Soft, gravid, appropriate for gestational age. Pain/Pressure: Absent     Pelvic:  Cervical exam deferred        Extremities: Normal range of motion.  Edema: None  Mental Status: Normal mood and affect. Normal behavior. Normal judgment and thought content.     Assessment   22 y.o. G2P1001 at 6038w6d, EDD 02/06/2018 by Last Menstrual Period  presenting for a routine prenatal visit.  Plan   SECOND Problems (from 06/10/17 to present)    Problem Noted Resolved   History of shoulder dystocia in prior pregnancy, currently pregnant in third trimester 12/11/2017 by Tresea MallGledhill, Jane, CNM No   Supervision of normal pregnancy 06/10/2017 by Oswaldo ConroySchmid, Jacelyn Y, CNM No   Overview Signed 06/10/2017  6:56 PM by Oswaldo ConroySchmid, Jacelyn Y, CNM    [facility] Prenatal Labs  Dating  Blood type:     Genetic Screen 1 Screen:    AFP:     Quad:     NIPS: Antibody:   Anatomic US  Rubella:   Varicella:    GTT Early:               Third trimester:  RPR:     Rhogam  HBsAg:     TDaP vaccine                       Flu Shot: HIV:     Baby Food                                GBS:   Contraception  Pap:  CBB     CS/VBAC    Support Person               Discussed increasing hydration to help with reducing frequency of Braxton Hicks contractions.  Will schedule growth/presentation scan for week of December 1 per patient preference so that her partner may be present.   Return in about 2 weeks (around 01/08/2018) for ROB .  Marcelyn BruinsJacelyn Schmid, CNM 12/25/2017  11:28 AM

## 2018-01-07 ENCOUNTER — Encounter: Payer: BLUE CROSS/BLUE SHIELD | Admitting: Maternal Newborn

## 2018-01-08 ENCOUNTER — Encounter: Payer: Self-pay | Admitting: Obstetrics & Gynecology

## 2018-01-08 ENCOUNTER — Ambulatory Visit (INDEPENDENT_AMBULATORY_CARE_PROVIDER_SITE_OTHER): Payer: BLUE CROSS/BLUE SHIELD

## 2018-01-08 ENCOUNTER — Ambulatory Visit (INDEPENDENT_AMBULATORY_CARE_PROVIDER_SITE_OTHER): Payer: BLUE CROSS/BLUE SHIELD | Admitting: Obstetrics & Gynecology

## 2018-01-08 VITALS — BP 102/62 | Wt 155.0 lb

## 2018-01-08 DIAGNOSIS — O09293 Supervision of pregnancy with other poor reproductive or obstetric history, third trimester: Secondary | ICD-10-CM

## 2018-01-08 DIAGNOSIS — Z3480 Encounter for supervision of other normal pregnancy, unspecified trimester: Secondary | ICD-10-CM

## 2018-01-08 DIAGNOSIS — Z3A35 35 weeks gestation of pregnancy: Secondary | ICD-10-CM

## 2018-01-08 DIAGNOSIS — Z3689 Encounter for other specified antenatal screening: Secondary | ICD-10-CM

## 2018-01-08 DIAGNOSIS — Z362 Encounter for other antenatal screening follow-up: Secondary | ICD-10-CM | POA: Diagnosis not present

## 2018-01-08 LAB — POCT URINALYSIS DIPSTICK OB
Glucose, UA: NEGATIVE
PROTEIN: NEGATIVE

## 2018-01-08 NOTE — Patient Instructions (Signed)
Braxton Hicks Contractions °Contractions of the uterus can occur throughout pregnancy, but they are not always a sign that you are in labor. You may have practice contractions called Braxton Hicks contractions. These false labor contractions are sometimes confused with true labor. °What are Braxton Hicks contractions? °Braxton Hicks contractions are tightening movements that occur in the muscles of the uterus before labor. Unlike true labor contractions, these contractions do not result in opening (dilation) and thinning of the cervix. Toward the end of pregnancy (32-34 weeks), Braxton Hicks contractions can happen more often and may become stronger. These contractions are sometimes difficult to tell apart from true labor because they can be very uncomfortable. You should not feel embarrassed if you go to the hospital with false labor. °Sometimes, the only way to tell if you are in true labor is for your health care provider to look for changes in the cervix. The health care provider will do a physical exam and may monitor your contractions. If you are not in true labor, the exam should show that your cervix is not dilating and your water has not broken. °If there are other health problems associated with your pregnancy, it is completely safe for you to be sent home with false labor. You may continue to have Braxton Hicks contractions until you go into true labor. °How to tell the difference between true labor and false labor °True labor °· Contractions last 30-70 seconds. °· Contractions become very regular. °· Discomfort is usually felt in the top of the uterus, and it spreads to the lower abdomen and low back. °· Contractions do not go away with walking. °· Contractions usually become more intense and increase in frequency. °· The cervix dilates and gets thinner. °False labor °· Contractions are usually shorter and not as strong as true labor contractions. °· Contractions are usually irregular. °· Contractions  are often felt in the front of the lower abdomen and in the groin. °· Contractions may go away when you walk around or change positions while lying down. °· Contractions get weaker and are shorter-lasting as time goes on. °· The cervix usually does not dilate or become thin. °Follow these instructions at home: °· Take over-the-counter and prescription medicines only as told by your health care provider. °· Keep up with your usual exercises and follow other instructions from your health care provider. °· Eat and drink lightly if you think you are going into labor. °· If Braxton Hicks contractions are making you uncomfortable: °? Change your position from lying down or resting to walking, or change from walking to resting. °? Sit and rest in a tub of warm water. °? Drink enough fluid to keep your urine pale yellow. Dehydration may cause these contractions. °? Do slow and deep breathing several times an hour. °· Keep all follow-up prenatal visits as told by your health care provider. This is important. °Contact a health care provider if: °· You have a fever. °· You have continuous pain in your abdomen. °Get help right away if: °· Your contractions become stronger, more regular, and closer together. °· You have fluid leaking or gushing from your vagina. °· You pass blood-tinged mucus (bloody show). °· You have bleeding from your vagina. °· You have low back pain that you never had before. °· You feel your baby’s head pushing down and causing pelvic pressure. °· Your baby is not moving inside you as much as it used to. °Summary °· Contractions that occur before labor are called Braxton   Hicks contractions, false labor, or practice contractions. °· Braxton Hicks contractions are usually shorter, weaker, farther apart, and less regular than true labor contractions. True labor contractions usually become progressively stronger and regular and they become more frequent. °· Manage discomfort from Braxton Hicks contractions by  changing position, resting in a warm bath, drinking plenty of water, or practicing deep breathing. °This information is not intended to replace advice given to you by your health care provider. Make sure you discuss any questions you have with your health care provider. °Document Released: 06/14/2016 Document Revised: 06/14/2016 Document Reviewed: 06/14/2016 °Elsevier Interactive Patient Education © 2018 Elsevier Inc. ° °

## 2018-01-08 NOTE — Progress Notes (Signed)
  Subjective  Fetal Movement? yes Contractions? no Leaking Fluid? no Vaginal Bleeding? no  Objective  BP 102/62   Wt 155 lb (70.3 kg)   LMP 05/02/2017 (Exact Date)   BMI 27.46 kg/m  General: NAD Pumonary: no increased work of breathing Abdomen: gravid, non-tender Extremities: no edema Psychiatric: mood appropriate, affect full  Assessment  22 y.o. G2P1001 at 568w6d by  02/06/2018, by Last Menstrual Period presenting for routine prenatal visit  Plan   Problem List Items Addressed This Visit      Other   Supervision of normal pregnancy   History of shoulder dystocia in prior pregnancy, currently pregnant in third trimester    Other Visit Diagnoses    [redacted] weeks gestation of pregnancy    -  Primary   Relevant Orders   POC Urinalysis Dipstick OB (Completed)   Encounter for ultrasound to check fetal growth        Review of ULTRASOUND.    I have personally reviewed images and [facility]    Plan of management to be discussed with patient.    Prominent pelvis of fetus discussed, need to let peds know after delivery  IOL at 39 weeks discussed due to prior h/o birth trauma, risks of IOL discussed and that it may not help prevent shoulder dystocia and has risks for CS    Prefer to schedule if has favorable cervix.  Can determine on a week to week basis  Annamarie MajorPaul Natale Thoma, MD, Merlinda FrederickFACOG [facility], [facility] 01/08/2018  2:27 PM    Annamarie MajorPaul Mistee Soliman, MD, Merlinda FrederickFACOG [facility], [facility] 01/08/2018  2:27 PM

## 2018-01-15 ENCOUNTER — Encounter: Payer: BLUE CROSS/BLUE SHIELD | Admitting: Obstetrics and Gynecology

## 2018-01-16 ENCOUNTER — Other Ambulatory Visit (HOSPITAL_COMMUNITY)
Admission: RE | Admit: 2018-01-16 | Discharge: 2018-01-16 | Disposition: A | Discharge status: Home / Self Care | Payer: BLUE CROSS/BLUE SHIELD | Source: Ambulatory Visit | Attending: Certified Nurse Midwife | Admitting: Certified Nurse Midwife

## 2018-01-16 ENCOUNTER — Other Ambulatory Visit: Payer: BLUE CROSS/BLUE SHIELD

## 2018-01-16 ENCOUNTER — Encounter: Payer: BLUE CROSS/BLUE SHIELD | Admitting: Obstetrics and Gynecology

## 2018-01-16 ENCOUNTER — Ambulatory Visit (INDEPENDENT_AMBULATORY_CARE_PROVIDER_SITE_OTHER): Payer: BLUE CROSS/BLUE SHIELD | Admitting: Certified Nurse Midwife

## 2018-01-16 VITALS — BP 90/40 | Wt 158.0 lb

## 2018-01-16 DIAGNOSIS — Z3685 Encounter for antenatal screening for Streptococcus B: Secondary | ICD-10-CM

## 2018-01-16 DIAGNOSIS — Z113 Encounter for screening for infections with a predominantly sexual mode of transmission: Secondary | ICD-10-CM | POA: Diagnosis present

## 2018-01-16 DIAGNOSIS — Z3A37 37 weeks gestation of pregnancy: Secondary | ICD-10-CM

## 2018-01-16 DIAGNOSIS — Z3480 Encounter for supervision of other normal pregnancy, unspecified trimester: Secondary | ICD-10-CM

## 2018-01-16 DIAGNOSIS — O09293 Supervision of pregnancy with other poor reproductive or obstetric history, third trimester: Secondary | ICD-10-CM

## 2018-01-16 LAB — POCT URINALYSIS DIPSTICK OB
Glucose, UA: NEGATIVE
PROTEIN: NEGATIVE

## 2018-01-16 NOTE — Progress Notes (Signed)
ROB at 37 weeks. History of shoulder dystocia with previous delivery of 9# baby. Wants to schedule IOL at 39 weeks. On 11/27 EFW was 6# on ultrasound Wants to breast feed/ ?IUD for contraception. Aptima and GBS done Cervix closed and thick/-1 to -2/ medium Scheduled for IOL on 19 December, but advised that the date may change if cervix unripe. Given # for L&D and labor precautions ROB in 1 week  Farrel Connersolleen Valyn Latchford, CNM

## 2018-01-16 NOTE — Progress Notes (Signed)
ROB- no concerns 

## 2018-01-19 LAB — STREP GP B NAA: Strep Gp B NAA: NEGATIVE

## 2018-01-20 LAB — CERVICOVAGINAL ANCILLARY ONLY
CHLAMYDIA, DNA PROBE: NEGATIVE
Neisseria Gonorrhea: NEGATIVE

## 2018-01-21 ENCOUNTER — Encounter: Payer: BLUE CROSS/BLUE SHIELD | Admitting: Maternal Newborn

## 2018-01-23 ENCOUNTER — Ambulatory Visit (INDEPENDENT_AMBULATORY_CARE_PROVIDER_SITE_OTHER): Payer: BLUE CROSS/BLUE SHIELD | Admitting: Maternal Newborn

## 2018-01-23 ENCOUNTER — Encounter: Payer: Self-pay | Admitting: Certified Nurse Midwife

## 2018-01-23 VITALS — BP 100/60 | Wt 158.0 lb

## 2018-01-23 DIAGNOSIS — O09293 Supervision of pregnancy with other poor reproductive or obstetric history, third trimester: Secondary | ICD-10-CM

## 2018-01-23 DIAGNOSIS — Z3A38 38 weeks gestation of pregnancy: Secondary | ICD-10-CM

## 2018-01-23 DIAGNOSIS — Z3480 Encounter for supervision of other normal pregnancy, unspecified trimester: Secondary | ICD-10-CM

## 2018-01-23 NOTE — Progress Notes (Signed)
    Routine Prenatal Care Visit  Subjective  Emily Mosley is a 22 y.o. G2P1001 at 5352w0d being seen today for ongoing prenatal care.  She is currently monitored for the following issues for this low-risk pregnancy and has Supervision of normal pregnancy and History of shoulder dystocia in prior pregnancy, currently pregnant in third trimester on their problem list.  ----------------------------------------------------------------------------------- Patient reports no complaints.   Contractions: Not present. Vag. Bleeding: None.  Movement: Present. No leaking of fluid.  ----------------------------------------------------------------------------------- The following portions of the patient's history were reviewed and updated as appropriate: allergies, current medications, past family history, past medical history, past social history, past surgical history and problem list. Problem list updated.  Objective  Blood pressure 100/60, weight 158 lb (71.7 kg), last menstrual period 05/02/2017.  Pregravid weight 132 lb (59.9 kg) Total Weight Gain 26 lb (11.8 kg) Body mass index is 27.99 kg/m.   Fetal Status: Fetal Heart Rate (bpm): 143   Movement: Present     General:  Alert, oriented and cooperative. Patient is in no acute distress.  Skin: Skin is warm and dry. No rash noted.   Cardiovascular: Normal heart rate noted  Respiratory: Normal respiratory effort, no problems with respiration noted  Abdomen: Soft, gravid, appropriate for gestational age. Pain/Pressure: Absent     Pelvic:  Cervical exam deferred        Extremities: Normal range of motion.  Edema: None  Mental Status: Normal mood and affect. Normal behavior. Normal judgment and thought content.    Assessment   22 y.o. G2P1001 at 2052w0d, EDD 02/06/2018 by Last Menstrual Period presenting for a routine prenatal visit.  Plan   SECOND Problems (from 06/10/17 to present)    Problem Noted Resolved   History of shoulder dystocia  in prior pregnancy, currently pregnant in third trimester 12/11/2017 by Tresea MallGledhill, Jane, CNM No   Supervision of normal pregnancy 06/10/2017 by Oswaldo ConroySchmid, Jacelyn Y, CNM No   Overview Addendum 01/22/2018  5:45 PM by Farrel ConnersGutierrez, Colleen, CNM    [facility] Prenatal Labs  Dating  Blood type:     Genetic Screen 1 Screen:    AFP:     Quad:     NIPS: Antibody:   Anatomic US  Rubella:   Varicella:    GTT Early:               Third trimester:  RPR:     Rhogam  HBsAg:     TDaP vaccine                       Flu Shot: HIV:     Baby Food                                GBS:   Contraception  Pap:  CBB     CS/VBAC    Support Person                 Term labor symptoms and general obstetric precautions including but not limited to vaginal bleeding, contractions, leaking of fluid and fetal movement were reviewed.  Please refer to After Visit Summary for other counseling recommendations.   Scheduled for induction of labor on 01/30/2018.  Marcelyn BruinsJacelyn Schmid, CNM 01/23/2018  11:58 AM

## 2018-01-23 NOTE — Patient Instructions (Signed)
Labor Induction Labor induction is when steps are taken to cause a pregnant woman to begin the labor process. Most women go into labor on their own between 37 weeks and 42 weeks of the pregnancy. When this does not happen or when there is a medical need, methods may be used to induce labor. Labor induction causes a pregnant woman's uterus to contract. It also causes the cervix to soften (ripen), open (dilate), and thin out (efface). Usually, labor is not induced before 39 weeks of the pregnancy unless there is a problem with the baby or mother. Before inducing labor, your health care provider will consider a number of factors, including the following:  The medical condition of you and the baby.  How many weeks along you are.  The status of the baby's lung maturity.  The condition of the cervix.  The position of the baby. What are the reasons for labor induction? Labor may be induced for the following reasons:  The health of the baby or mother is at risk.  The pregnancy is overdue by 1 week or more.  The water breaks but labor does not start on its own.  The mother has a health condition or serious illness, such as high blood pressure, infection, placental abruption, or diabetes.  The amniotic fluid amounts are low around the baby.  The baby is distressed. Convenience or wanting the baby to be born on a certain date is not a reason for inducing labor. What methods are used for labor induction? Several methods of labor induction may be used, such as:  Prostaglandin medicine. This medicine causes the cervix to dilate and ripen. The medicine will also start contractions. It can be taken by mouth or by inserting a suppository into the vagina.  Inserting a thin tube (catheter) with a balloon on the end into the vagina to dilate the cervix. Once inserted, the balloon is expanded with water, which causes the cervix to open.  Stripping the membranes. Your health care provider separates  amniotic sac tissue from the cervix, causing the cervix to be stretched and causing the release of a hormone called progesterone. This may cause the uterus to contract. It is often done during an office visit. You will be sent home to wait for the contractions to begin. You will then come in for an induction.  Breaking the water. Your health care provider makes a hole in the amniotic sac using a small instrument. Once the amniotic sac breaks, contractions should begin. This may still take hours to see an effect.  Medicine to trigger or strengthen contractions. This medicine is given through an IV access tube inserted into a vein in your arm. All of the methods of induction, besides stripping the membranes, will be done in the hospital. Induction is done in the hospital so that you and the baby can be carefully monitored. How long does it take for labor to be induced? Some inductions can take up to 2-3 days. Depending on the cervix, it usually takes less time. It takes longer when you are induced early in the pregnancy or if this is your first pregnancy. If a mother is still pregnant and the induction has been going on for 2-3 days, either the mother will be sent home or a cesarean delivery will be needed. What are the risks associated with labor induction? Some of the risks of induction include:  Changes in fetal heart rate, such as too high, too low, or erratic.  Fetal distress.    Chance of infection for the mother and baby.  Increased chance of having a cesarean delivery.  Breaking off (abruption) of the placenta from the uterus (rare).  Uterine rupture (very rare). When induction is needed for medical reasons, the benefits of induction may outweigh the risks. What are some reasons for not inducing labor? Labor induction should not be done if:  It is shown that your baby does not tolerate labor.  You have had previous surgeries on your uterus, such as a myomectomy or the removal of  fibroids.  Your placenta lies very low in the uterus and blocks the opening of the cervix (placenta previa).  Your baby is not in a head-down position.  The umbilical cord drops down into the birth canal in front of the baby. This could cut off the baby's blood and oxygen supply.  You have had a previous cesarean delivery.  There are unusual circumstances, such as the baby being extremely premature. This information is not intended to replace advice given to you by your health care provider. Make sure you discuss any questions you have with your health care provider. Document Released: 06/20/2006 Document Revised: 07/07/2015 Document Reviewed: 08/28/2012 Elsevier Interactive Patient Education  2017 Elsevier Inc.  

## 2018-01-24 ENCOUNTER — Ambulatory Visit
Admission: EM | Admit: 2018-01-24 | Discharge: 2018-01-24 | Disposition: A | Discharge status: Home / Self Care | Payer: BLUE CROSS/BLUE SHIELD | Attending: Family Medicine | Admitting: Family Medicine

## 2018-01-24 DIAGNOSIS — J988 Other specified respiratory disorders: Secondary | ICD-10-CM | POA: Diagnosis not present

## 2018-01-24 DIAGNOSIS — B9789 Other viral agents as the cause of diseases classified elsewhere: Secondary | ICD-10-CM | POA: Diagnosis not present

## 2018-01-24 NOTE — Discharge Instructions (Signed)
Continue supportive care.  No need to worry.  Dr. Adriana Simasook

## 2018-01-24 NOTE — ED Triage Notes (Signed)
Pt is here for a cough for 1 week. Has been pushing fluids does have a brown tint to it but has been having problems coughing it up due to being too thick. Has been having sore throat as well and has been worse at night when she is laying down. Getting induced on 12/19 so wanted to make sure she is fine before then.

## 2018-01-24 NOTE — ED Provider Notes (Signed)
[facility]    CSN: [id number] Arrival date & time: 01/24/18  1720  History   Chief Complaint Chief Complaint  Patient presents with  . Cough   HPI  22 year old female who is currently pregnant and scheduled to be induced on 12/19  presents with cough.  Patient works in a pediatric office.  Patient states that she has had a one-week history of cough.  Mostly dry.  She has coughed up some thick, brown mucus.  She reports associated sore throat at night.  Cough is worse at night.  She has been pushing fluids without improvement.  She has had no fever.  No reports of shortness of breath.  No known relieving factors.  No other associated symptoms.  Patient wanted to be examined to ensure that everything was okay pending her upcoming induction.  PMH, Surgical Hx, Family Hx, Social History reviewed and updated as below.  Past Medical History:  Diagnosis Date  . Shoulder dystocia, delivered     Patient Active Problem List   Diagnosis Date Noted  . History of shoulder dystocia in prior pregnancy, currently pregnant in third trimester 12/11/2017  . Supervision of normal pregnancy 06/10/2017    Past Surgical History:  Procedure Laterality Date  . APPENDECTOMY    . EYE MUSCLE SURGERY     AGE 28  . WISDOM TOOTH EXTRACTION  2015   ALL FOUR    OB History    Gravida  2   Para  1   Term  1   Preterm      AB      Living  1     SAB      TAB      Ectopic      Multiple      Live Births  1            Home Medications    Prior to Admission medications   Medication Sig Start Date End Date Taking? Authorizing Provider  Prenat-FeCbn-FeAsp-Meth-FA-DHA (PRENATE MINI) 18-0.6-0.4-350 MG CAPS Take 1 capsule by mouth daily. 07/29/17  Yes Nadara MustardHarris, Robert P, MD    Family History Family History  Problem Relation Age of Onset  . Healthy Mother   . Healthy Father   . Thyroid disease Maternal Grandmother        HYPO     Social History Social History    Tobacco Use  . Smoking status: Never Smoker  . Smokeless tobacco: Never Used  Substance Use Topics  . Alcohol use: No  . Drug use: No     Allergies   Patient has no known allergies.   Review of Systems Review of Systems  Constitutional: Negative for fever.  HENT: Positive for sore throat.   Respiratory: Positive for cough.    Physical Exam Triage Vital Signs ED Triage Vitals  Enc Vitals Group     BP 01/24/18 1727 116/68     Pulse Rate 01/24/18 1727 70     Resp 01/24/18 1727 18     Temp 01/24/18 1727 98.1 F (36.7 C)     Temp src --      SpO2 01/24/18 1727 100 %     Weight 01/24/18 1729 158 lb (71.7 kg)     Height --      Head Circumference --      Peak Flow --      Pain Score 01/24/18 1729 0     Pain Loc --      Pain Edu? --  Excl. in GC? --    Updated Vital Signs BP 116/68 (BP Location: Left Arm)   Pulse 70   Temp 98.1 F (36.7 C)   Resp 18   Wt 71.7 kg   LMP 05/02/2017 (Exact Date)   SpO2 100%   BMI 27.99 kg/m   Visual Acuity Right Eye Distance:   Left Eye Distance:   Bilateral Distance:    Right Eye Near:   Left Eye Near:    Bilateral Near:     Physical Exam Vitals signs and nursing note reviewed.  Constitutional:      General: She is not in acute distress.    Appearance: Normal appearance.  HENT:     Head: Normocephalic and atraumatic.     Right Ear: Tympanic membrane normal.     Left Ear: Tympanic membrane normal.     Mouth/Throat:     Pharynx: Oropharynx is clear. No oropharyngeal exudate.  Cardiovascular:     Rate and Rhythm: Normal rate and regular rhythm.  Pulmonary:     Effort: Pulmonary effort is normal. No respiratory distress.     Breath sounds: No wheezing, rhonchi or rales.  Neurological:     Mental Status: She is alert.  Psychiatric:        Mood and Affect: Mood normal.        Behavior: Behavior normal.    UC Treatments / Results  Labs (all labs ordered are listed, but only abnormal results are  displayed) Labs Reviewed - No data to display  EKG None  Radiology No results found.  Procedures Procedures (including critical care time)  Medications Ordered in UC Medications - No data to display  Initial Impression / Assessment and Plan / UC Course  I have reviewed the triage vital signs and the nursing notes.  Pertinent labs & imaging results that were available during my care of the patient were reviewed by me and considered in my medical decision making (see chart for details).    22 year old female presents with a viral respiratory infection.  Advised supportive care.  No medications or other interventions at this time, especially in pregnancy.  Final Clinical Impressions(s) / UC Diagnoses   Final diagnoses:  Viral respiratory infection     Discharge Instructions     Continue supportive care.  No need to worry.  Dr. Adriana Simas    ED Prescriptions    None     Controlled Substance Prescriptions  Controlled Substance Registry consulted? Not Applicable   Tommie Sams, DO 01/24/18 1826

## 2018-01-30 ENCOUNTER — Inpatient Hospital Stay: Payer: BLUE CROSS/BLUE SHIELD | Admitting: Anesthesiology

## 2018-01-30 ENCOUNTER — Inpatient Hospital Stay
Admission: EM | Admit: 2018-01-30 | Discharge: 2018-02-01 | DRG: 807 | Disposition: A | Discharge status: Home / Self Care | Payer: BLUE CROSS/BLUE SHIELD | Attending: Obstetrics & Gynecology | Admitting: Obstetrics & Gynecology

## 2018-01-30 ENCOUNTER — Other Ambulatory Visit: Payer: Self-pay

## 2018-01-30 DIAGNOSIS — Z3A39 39 weeks gestation of pregnancy: Secondary | ICD-10-CM

## 2018-01-30 DIAGNOSIS — O26893 Other specified pregnancy related conditions, third trimester: Secondary | ICD-10-CM | POA: Diagnosis present

## 2018-01-30 DIAGNOSIS — Z3490 Encounter for supervision of normal pregnancy, unspecified, unspecified trimester: Secondary | ICD-10-CM | POA: Diagnosis present

## 2018-01-30 LAB — TYPE AND SCREEN
ABO/RH(D): B POS
ANTIBODY SCREEN: NEGATIVE

## 2018-01-30 LAB — CBC
HCT: 35.4 % — ABNORMAL LOW (ref 36.0–46.0)
Hemoglobin: 11.9 g/dL — ABNORMAL LOW (ref 12.0–15.0)
MCH: 28.4 pg (ref 26.0–34.0)
MCHC: 33.6 g/dL (ref 30.0–36.0)
MCV: 84.5 fL (ref 80.0–100.0)
Platelets: 177 10*3/uL (ref 150–400)
RBC: 4.19 MIL/uL (ref 3.87–5.11)
RDW: 12.9 % (ref 11.5–15.5)
WBC: 10.5 10*3/uL (ref 4.0–10.5)
nRBC: 0 % (ref 0.0–0.2)

## 2018-01-30 MED ORDER — LACTATED RINGERS IV SOLN: 500.0000 mL | Freq: Once | INTRAVENOUS | Status: DC

## 2018-01-30 MED ORDER — TERBUTALINE SULFATE 1 MG/ML IJ SOLN: 0.2500 mg | Freq: Once | INTRAMUSCULAR | Status: DC | PRN

## 2018-01-30 MED ORDER — LIDOCAINE HCL (PF) 1 % IJ SOLN
INTRAMUSCULAR | Status: DC | PRN
  Administered 2018-01-30: 3 mL via SUBCUTANEOUS

## 2018-01-30 MED ORDER — LACTATED RINGERS IV SOLN: 500.0000 mL | INTRAVENOUS | Status: DC | PRN

## 2018-01-30 MED ORDER — DIPHENHYDRAMINE HCL 50 MG/ML IJ SOLN: 12.5000 mg | INTRAMUSCULAR | Status: DC | PRN

## 2018-01-30 MED ORDER — OXYTOCIN BOLUS FROM INFUSION
500.0000 mL | Freq: Once | INTRAVENOUS | Status: AC
  Administered 2018-01-30: 500 mL via INTRAVENOUS

## 2018-01-30 MED ORDER — FENTANYL 2.5 MCG/ML W/ROPIVACAINE 0.15% IN NS 100 ML EPIDURAL (ARMC)
EPIDURAL | Status: AC
  Filled 2018-01-30: qty 100

## 2018-01-30 MED ORDER — FENTANYL 2.5 MCG/ML W/ROPIVACAINE 0.15% IN NS 100 ML EPIDURAL (ARMC)
EPIDURAL | Status: DC | PRN
  Administered 2018-01-30: 12 mL/h via EPIDURAL

## 2018-01-30 MED ORDER — EPHEDRINE 5 MG/ML INJ
10.0000 mg | INTRAVENOUS | Status: DC | PRN
  Filled 2018-01-30: qty 2

## 2018-01-30 MED ORDER — MISOPROSTOL 25 MCG QUARTER TABLET
ORAL_TABLET | ORAL | Status: AC
  Administered 2018-01-30: 25 ug
  Filled 2018-01-30: qty 1

## 2018-01-30 MED ORDER — FENTANYL 2.5 MCG/ML W/ROPIVACAINE 0.15% IN NS 100 ML EPIDURAL (ARMC): 12.0000 mL/h | EPIDURAL | Status: DC

## 2018-01-30 MED ORDER — PHENYLEPHRINE 40 MCG/ML (10ML) SYRINGE FOR IV PUSH (FOR BLOOD PRESSURE SUPPORT)
80.0000 ug | PREFILLED_SYRINGE | INTRAVENOUS | Status: DC | PRN
  Filled 2018-01-30: qty 10

## 2018-01-30 MED ORDER — AMMONIA AROMATIC IN INHA: 0.3000 mL | Freq: Once | RESPIRATORY_TRACT | Status: DC | PRN

## 2018-01-30 MED ORDER — LIDOCAINE HCL (PF) 1 % IJ SOLN: 30.0000 mL | INTRAMUSCULAR | Status: DC | PRN

## 2018-01-30 MED ORDER — OXYTOCIN 40 UNITS IN LACTATED RINGERS INFUSION - SIMPLE MED
1.0000 m[IU]/min | INTRAVENOUS | Status: DC
  Administered 2018-01-30: 2 m[IU]/min via INTRAVENOUS
  Filled 2018-01-30: qty 1000

## 2018-01-30 MED ORDER — ACETAMINOPHEN 500 MG PO TABS
1000.0000 mg | ORAL_TABLET | Freq: Once | ORAL | Status: AC
  Administered 2018-01-30: 1000 mg via ORAL
  Filled 2018-01-30: qty 2

## 2018-01-30 MED ORDER — ONDANSETRON HCL 4 MG/2ML IJ SOLN: 4.0000 mg | Freq: Four times a day (QID) | INTRAMUSCULAR | Status: DC | PRN

## 2018-01-30 MED ORDER — AMMONIA AROMATIC IN INHA
RESPIRATORY_TRACT | Status: AC
  Filled 2018-01-30: qty 10

## 2018-01-30 MED ORDER — MISOPROSTOL 200 MCG PO TABS: 800.0000 ug | ORAL_TABLET | Freq: Once | ORAL | Status: DC | PRN

## 2018-01-30 MED ORDER — OXYTOCIN 40 UNITS IN LACTATED RINGERS INFUSION - SIMPLE MED
2.5000 [IU]/h | INTRAVENOUS | Status: DC
  Filled 2018-01-30: qty 1000

## 2018-01-30 MED ORDER — LACTATED RINGERS IV SOLN
INTRAVENOUS | Status: DC
  Administered 2018-01-30 (×2): via INTRAVENOUS

## 2018-01-30 MED ORDER — IBUPROFEN 600 MG PO TABS
600.0000 mg | ORAL_TABLET | Freq: Four times a day (QID) | ORAL | Status: DC
  Administered 2018-01-31 – 2018-02-01 (×7): 600 mg via ORAL
  Filled 2018-01-30 (×7): qty 1

## 2018-01-30 MED ORDER — LIDOCAINE-EPINEPHRINE (PF) 1.5 %-1:200000 IJ SOLN
INTRAMUSCULAR | Status: DC | PRN
  Administered 2018-01-30: 3 mL via EPIDURAL

## 2018-01-30 MED ORDER — MISOPROSTOL 25 MCG QUARTER TABLET
25.0000 ug | ORAL_TABLET | ORAL | Status: DC | PRN
  Administered 2018-01-30: 25 ug via VAGINAL
  Filled 2018-01-30: qty 1

## 2018-01-30 MED ORDER — OXYTOCIN 10 UNIT/ML IJ SOLN
INTRAMUSCULAR | Status: AC
  Filled 2018-01-30: qty 2

## 2018-01-30 MED ORDER — SODIUM CHLORIDE 0.9 % IV SOLN
INTRAVENOUS | Status: DC | PRN
  Administered 2018-01-30 (×2): 5 mL via EPIDURAL

## 2018-01-30 MED ORDER — MISOPROSTOL 200 MCG PO TABS
ORAL_TABLET | ORAL | Status: AC
  Filled 2018-01-30: qty 4

## 2018-01-30 MED ORDER — LIDOCAINE HCL (PF) 1 % IJ SOLN
INTRAMUSCULAR | Status: AC
  Filled 2018-01-30: qty 30

## 2018-01-30 NOTE — Anesthesia Procedure Notes (Signed)
Epidural Patient location during procedure: OB Start time: 01/30/2018 7:43 PM End time: 01/30/2018 7:52 PM  Staffing Anesthesiologist: Lenard SimmerKarenz, Marcellius Montagna, MD Performed: anesthesiologist   Preanesthetic Checklist Completed: patient identified, site marked, surgical consent, pre-op evaluation, timeout performed, IV checked, risks and benefits discussed and monitors and equipment checked  Epidural Patient position: sitting Prep: ChloraPrep Patient monitoring: heart rate, continuous pulse ox and blood pressure Approach: midline Location: L3-L4 Injection technique: LOR saline  Needle:  Needle type: Tuohy  Needle gauge: 17 G Needle length: 9 cm and 9 Needle insertion depth: 3.5 cm Catheter type: closed end flexible Catheter size: 19 Gauge Catheter at skin depth: 8 cm Test dose: negative and 1.5% lidocaine with Epi 1:200 K  Assessment Sensory level: T10 Events: blood not aspirated, injection not painful, no injection resistance, negative IV test and no paresthesia  Additional Notes 1st attempt Pt. Evaluated and documentation done after procedure finished. Patient identified. Risks/Benefits/Options discussed with patient including but not limited to bleeding, infection, nerve damage, paralysis, failed block, incomplete pain control, headache, blood pressure changes, nausea, vomiting, reactions to medication both or allergic, itching and postpartum back pain. Confirmed with bedside nurse the patient's most recent platelet count. Confirmed with patient that they are not currently taking any anticoagulation, have any bleeding history or any family history of bleeding disorders. Patient expressed understanding and wished to proceed. All questions were answered. Sterile technique was used throughout the entire procedure. Please see nursing notes for vital signs. Test dose was given through epidural catheter and negative prior to continuing to dose epidural or start infusion. Warning signs of high  block given to the patient including shortness of breath, tingling/numbness in hands, complete motor block, or any concerning symptoms with instructions to call for help. Patient was given instructions on fall risk and not to get out of bed. All questions and concerns addressed with instructions to call with any issues or inadequate analgesia.   Patient tolerated the insertion well without immediate complications.Reason for block:procedure for pain

## 2018-01-30 NOTE — Progress Notes (Signed)
  Labor Progress Note   22 y.o. G2P1001 @ 5827w0d , admitted for  Pregnancy, Labor Management.   Subjective:  Mod pain, no meds requested  Objective:  BP 114/66   Pulse (!) 58   Temp 97.6 F (36.4 C) (Oral)   Resp 16   Ht 5\' 4"  (1.626 m)   Wt 71.7 kg   LMP 05/02/2017 (Exact Date)   BMI 27.12 kg/m  Abd: mild Extr: trace to 1+ bilateral pedal edema SVE: CERVIX: 3 cm dilated, 75 effaced, -2 station  EFM: FHR: 140 bpm, variability: moderate,  accelerations:  Present,  decelerations:  Absent Toco: Frequency: Every 3-4 minutes Labs: I have reviewed the patient's lab results.   Assessment & Plan:  G2P1001 @ 4727w0d, admitted for  Pregnancy and Labor/Delivery Management  1. Pain management: none. 2. FWB: FHT category 1.  3. ID: GBS negative 4. Labor management: AROM clear Epidural when ready  All discussed with patient, see orders  Annamarie MajorPaul Carlitos Bottino, MD, Merlinda FrederickFACOG [facility], [facility] 01/30/2018  6:08 PM

## 2018-01-30 NOTE — Discharge Summary (Signed)
OB Discharge Summary     Patient Name: Emily Mosley DOB: July 26, 1995 MRN: [id number]  Date of admission: 01/30/2018 Delivering MD: Letitia Libraobert Paul Harris, MD  Date of Delivery: 01/30/2018  Date of discharge: 02/01/2018  Admitting diagnosis: 39 weeks Inductions Intrauterine pregnancy: 2457w0d     Secondary diagnosis: history of shoulder dystocia     Discharge diagnosis: Term Pregnancy Delivered, history of shoulder dystocia                         Hospital course:  Induction of Labor With Vaginal Delivery   [age] y.o. yo G2P1001 at 4957w0d was admitted to the hospital 01/30/2018 for induction of labor.  Indication for induction: Favorable cervix at term.  Patient had an uncomplicated labor course as follows: Membrane Rupture Time/Date: 6:05 PM ,01/30/2018   Intrapartum Procedures: Episiotomy: None [1]                                         Lacerations:  None [1]  Patient had delivery of a Viable infant.  Information for the patient's newborn:  Nita Sellsineda, Boy Myley [[id number]][[id number]]  Delivery Method: Vag-Spont    Details of delivery can be found in separate delivery note.  Patient had a routine postpartum course. Patient is discharged home 02/01/18.                                                                 Post partum procedures:none  Complications: None  Physical exam on 02/01/2018: Vitals:   01/31/18 1314 01/31/18 1635 01/31/18 1923 02/01/18 0051  BP: 117/79 106/68 115/74 96/63  Pulse: 61 70 61 61  Resp: 20 18 20 20   Temp: 98.7 F (37.1 C) 97.8 F (36.6 C) 97.9 F (36.6 C) 97.6 F (36.4 C)  TempSrc: Oral Oral Oral Oral  SpO2: 97% 98% 100% 100%  Weight:      Height:       General: alert, cooperative and no distress Lochia: appropriate Uterine Fundus: firm Incision: N/A DVT Evaluation: No evidence of DVT seen on physical exam.  Labs: Lab Results  Component Value Date   WBC 15.7 (H) 01/31/2018   HGB 11.5 (L) 01/31/2018   HCT 34.5 (L) 01/31/2018   MCV 84.8  01/31/2018   PLT 166 01/31/2018   CMP Latest Ref Rng & Units 10/03/2013  Glucose 65 - 99 mg/dL 96  BUN 9 - 21 mg/dL 6(L)  Creatinine 6.290.60 - 1.30 mg/dL 5.28(U0.51(L)  Sodium 132132 - 440141 mmol/L 139  Potassium 3.3 - 4.7 mmol/L 3.7  Chloride 97 - 107 mmol/L 104  CO2 16 - 25 mmol/L 23  Calcium 9.0 - 10.7 mg/dL 9.1  Total Protein 6.4 - 8.6 g/dL 7.4  Total Bilirubin 0.2 - 1.0 mg/dL 0.3  Alkaline Phos Unit/L 44(L)  AST 0 - 26 Unit/L 26  ALT U/L 15    Discharge instruction: per After Visit Summary.  Medications:  Allergies as of 02/01/2018   No Known Allergies     Medication List    TAKE these medications   PRENATE MINI 18-0.6-0.4-350 MG Caps Take 1 capsule by mouth daily.  Diet: routine diet  Activity: Advance as tolerated. Pelvic rest for 6 weeks.   Outpatient follow up: Follow-up Information    Nadara MustardHarris, Robert P, MD. Schedule an appointment as soon as possible for a visit in 6 week(s).   Specialty:  Obstetrics and Gynecology Why:  for Post Partum Contact information: [address] [place] [address] [phone]             Postpartum contraception: IUD  Rhogam Given postpartum: no Rubella vaccine given postpartum: no Varicella vaccine given postpartum: no TDaP given antepartum or postpartum: last dose in the past year  Newborn Data: Live born adult  Birth Weight: 3460 g  APGAR: 8, 9  Newborn Delivery   Birth date/time:   Delivery type:        Baby Feeding: Breast with some formula supplemented  Disposition:home with mother  SIGNED:  Tresea MallJane Khari Mally, CNM 02/01/2018 7:09 AM

## 2018-01-30 NOTE — H&P (Signed)
OB History & Physical   History of Present Illness:  Chief Complaint:   HPI:  Emily Mosley is a 22 y.o. 222P1001 female with EDC=02/06/2018 at 7271w0d dated by LMP c/w 6wk ultrasound.  Her pregnancy has been complicated by a history of shoulder dystocia with her first delivery of a 9# baby at 42 weeks.  She has gained 26# with this pregnancy and had a normal glucola.  EFW 11/27 was 6#, so current EFW based upon this ultrasound is 71/2 to 8#. There was also bilateral prominent fetal pelves at the 11/27 ultrasound.  She presents to L&D for an elective induction  Having mild contractions. Denies vaginal bleeding or leakage. Seen last week in Urgent Care for URI (post nasal drainage and cough) [facility]  [facility] Prenatal Labs  Dating LMP c/w 6wk US Blood type: B/Positive/-- (05/08 1012)   Genetic Screen 1 Screen:    AFP:     Quad:     NIPS: declines Antibody:Negative (05/08 1012)  Anatomic US Normal anatomy in second trimester, anterior placenta. Prominent fetal pelves in third trimester Rubella: 2.41 (05/08 1012) Varicella: Immune (05/08 0000)  GTT Early:               Third trimester: 56 RPR: Non Reactive (10/03 1115)   Rhogam  HBsAg: Negative (05/08 1012)   TDaP vaccine    declined                   Flu Shot: HIV: Non Reactive (10/03 1115)   Baby Food         Breast                       ZOX:[id number]:Negative (12/06 1116)  Contraception IUD Pap:  CBB     CS/VBAC    Support Person             Maternal Medical History:   Past Medical History:  Diagnosis Date  . Shoulder dystocia, delivered     Past Surgical History:  Procedure Laterality Date  . APPENDECTOMY    . EYE MUSCLE SURGERY     AGE 89  . WISDOM TOOTH EXTRACTION  2015   ALL FOUR    No Known Allergies  Prior to Admission medications   Medication Sig Start Date End Date Taking? Authorizing Provider  Prenat-FeCbn-FeAsp-Meth-FA-DHA (PRENATE MINI)  18-0.6-0.4-350 MG CAPS Take 1 capsule by mouth daily. 07/29/17  Yes Nadara MustardHarris, Robert P, MD          Social History: She  reports that she has never smoked. She has never used smokeless tobacco. She reports that she does not drink alcohol or use drugs.  Family History: family history includes Healthy in her father and mother; Thyroid disease in her maternal grandmother.   Review of Systems: Negative x 10 systems reviewed except as noted in the HPI.      Physical Exam:  Vital Signs: BP 113/71 (BP Location: Left Arm)   Pulse 76   Temp 97.8 F (36.6 C) (Oral)   Resp 16   Ht 5\' 4"  (1.626 m)   Wt 71.7 kg   LMP 05/02/2017 (Exact Date)   BMI 27.12 kg/m  General: gravid female in no acute distress.  HEENT: normocephalic, atraumatic Heart: regular rate & rhythm.  No murmurs/rubs/gallops Lungs: clear to auscultation bilaterally Abdomen: soft, gravid, non-tender;  EFW: 7 1/2 to 8#, cephalic Pelvic:  External: Normal external female genitalia  Cervix: Dilation: 1 / Effacement (%): 40 / Station: -2   Extremities: non-tender, symmetric, no edema bilaterally.  DTRs: +1  Neurologic: Alert & oriented x 3.   Baseline FHR: 125 with accelerations to 150, moderate variability, no decelerations. Toco: irregular contractions  Assessment:  Emily Mosley is a 22 y.o. 792P1001 female at 2057w0d admitted for elective IOL for prior history of shoulder dystocia  FWB: Cat 1 Bishop score=5 Plan:  1. Admit to Labor & Delivery  For elective induction 2. CBC, T&S, Clrs, IVF 3. GBS negative.   4. Consents obtained. 5. Discussed risks of induction including hyperstimulation, fetal intolerance, Cesarean section. She verbalizes understanding and wishes to proceed with induction. Cytotec 25 mcg inserted vaginally. Consider foley bulb placement with next exam.  6. B POS/ RI/ VI 7. TDAP-declined 8. Breast/IUD    Emily Mosley  01/30/2018 6:47 AM

## 2018-01-30 NOTE — Anesthesia Preprocedure Evaluation (Signed)
Anesthesia Evaluation  Patient identified by MRN, date of birth, ID band Patient awake    Reviewed: Allergy & Precautions, H&P , NPO status , Patient's Chart, lab work & pertinent test results, reviewed documented beta blocker date and time   History of Anesthesia Complications (+) POST - OP SPINAL HEADACHE and history of anesthetic complications  Airway Mallampati: II  TM Distance: >3 FB Neck ROM: full    Dental no notable dental hx.    Pulmonary neg pulmonary ROS,           Cardiovascular Exercise Tolerance: Good negative cardio ROS       Neuro/Psych negative neurological ROS  negative psych ROS   GI/Hepatic negative GI ROS, Neg liver ROS,   Endo/Other  negative endocrine ROS  Renal/GU negative Renal ROS  negative genitourinary   Musculoskeletal   Abdominal   Peds  Hematology negative hematology ROS (+)   Anesthesia Other Findings Past Medical History: No date: Shoulder dystocia, delivered   Reproductive/Obstetrics (+) Pregnancy                             Anesthesia Physical Anesthesia Plan  ASA: II  Anesthesia Plan: Epidural   Post-op Pain Management:    Induction:   PONV Risk Score and Plan:   Airway Management Planned:   Additional Equipment:   Intra-op Plan:   Post-operative Plan:   Informed Consent: I have reviewed the patients History and Physical, chart, labs and discussed the procedure including the risks, benefits and alternatives for the proposed anesthesia with the patient or authorized representative who has indicated his/her understanding and acceptance.   Dental Advisory Given  Plan Discussed with: Anesthesiologist, CRNA and Surgeon  Anesthesia Plan Comments:         Anesthesia Quick Evaluation

## 2018-01-30 NOTE — Progress Notes (Signed)
  Labor Progress Note   22 y.o. G2P1001 @ 369w0d , admitted for  Pregnancy, Labor Management.   Subjective:  Mild pain  Objective:  BP 105/63   Pulse (!) 59   Temp 97.8 F (36.6 C) (Oral)   Resp 16   Ht 5\' 4"  (1.626 m)   Wt 71.7 kg   LMP 05/02/2017 (Exact Date)   BMI 27.12 kg/m  Abd: gravid, ND Extr: trace to 1+ bilateral pedal edema SVE: CERVIX: 2-3 cm dilated, 70 effaced, -2 station  EFM: FHR: 140 bpm, variability: moderate,  accelerations:  Present,  decelerations:  Absent Toco: Frequency: Every 2-4 minutes Labs: I have reviewed the patient's lab results.   Assessment & Plan:  G2P1001 @ 369w0d, admitted for  Pregnancy and Labor/Delivery Management  1. Pain management: none. 2. FWB: FHT category 1.  3. ID: GBS negative 4. Labor management: Pitocin now, after two dose Cytotec have been given.  AROM later.  All discussed with patient, see orders  Annamarie MajorPaul Calene Paradiso, MD, Merlinda FrederickFACOG [facility], [facility] 01/30/2018  2:15 PM

## 2018-01-30 NOTE — Discharge Instructions (Signed)
Vaginal Delivery, Care After °Refer to this sheet in the next few weeks. These instructions provide you with information about caring for yourself after vaginal delivery. Your health care provider may also give you more specific instructions. Your treatment has been planned according to current medical practices, but problems sometimes occur. Call your health care provider if you have any problems or questions. °What can I expect after the procedure? °After vaginal delivery, it is common to have: °· Some bleeding from your vagina. °· Soreness in your abdomen, your vagina, and the area of skin between your vaginal opening and your anus (perineum). °· Pelvic cramps. °· Fatigue. °Follow these instructions at home: °Medicines °· Take over-the-counter and prescription medicines only as told by your health care provider. °· If you were prescribed an antibiotic medicine, take it as told by your health care provider. Do not stop taking the antibiotic until it is finished. °Driving ° °· Do not drive or operate heavy machinery while taking prescription pain medicine. °· Do not drive for 24 hours if you received a sedative. °Lifestyle °· Do not drink alcohol. This is especially important if you are breastfeeding or taking medicine to relieve pain. °· Do not use tobacco products, including cigarettes, chewing tobacco, or e-cigarettes. If you need help quitting, ask your health care provider. °Eating and drinking °· Drink at least 8 eight-ounce glasses of water every day unless you are told not to by your health care provider. If you choose to breastfeed your baby, you may need to drink more water than this. °· Eat high-fiber foods every day. These foods may help prevent or relieve constipation. High-fiber foods include: °? Whole grain cereals and breads. °? Brown rice. °? Beans. °? Fresh fruits and vegetables. °Activity °· Return to your normal activities as told by your health care provider. Ask your health care provider what  activities are safe for you. °· Rest as much as possible. Try to rest or take a nap when your baby is sleeping. °· Do not lift anything that is heavier than your baby or 10 lb (4.5 kg) until your health care provider says that it is safe. °· Talk with your health care provider about when you can engage in sexual activity. This may depend on your: °? Risk of infection. °? Rate of healing. °? Comfort and desire to engage in sexual activity. °Vaginal Care °· If you have an episiotomy or a vaginal tear, check the area every day for signs of infection. Check for: °? More redness, swelling, or pain. °? More fluid or blood. °? Warmth. °? Pus or a bad smell. °· Do not use tampons or douches until your health care provider says this is safe. °· Watch for any blood clots that may pass from your vagina. These may look like clumps of dark red, brown, or black discharge. °General instructions °· Keep your perineum clean and dry as told by your health care provider. °· Wear loose, comfortable clothing. °· Wipe from front to back when you use the toilet. °· Ask your health care provider if you can shower or take a bath. If you had an episiotomy or a perineal tear during labor and delivery, your health care provider may tell you not to take baths for a certain length of time. °· Wear a bra that supports your breasts and fits you well. °· If possible, have someone help you with household activities and help care for your baby for at least a few days after you   leave the hospital. °· Keep all follow-up visits for you and your baby as told by your health care provider. This is important. °Contact a health care provider if: °· You have: °? Vaginal discharge that has a bad smell. °? Difficulty urinating. °? Pain when urinating. °? A sudden increase or decrease in the frequency of your bowel movements. °? More redness, swelling, or pain around your episiotomy or vaginal tear. °? More fluid or blood coming from your episiotomy or vaginal  tear. °? Pus or a bad smell coming from your episiotomy or vaginal tear. °? A fever. °? A rash. °? Little or no interest in activities you used to enjoy. °? Questions about caring for yourself or your baby. °· Your episiotomy or vaginal tear feels warm to the touch. °· Your episiotomy or vaginal tear is separating or does not appear to be healing. °· Your breasts are painful, hard, or turn red. °· You feel unusually sad or worried. °· You feel nauseous or you vomit. °· You pass large blood clots from your vagina. If you pass a blood clot from your vagina, save it to show to your health care provider. Do not flush blood clots down the toilet without having your health care provider look at them. °· You urinate more than usual. °· You are dizzy or light-headed. °· You have not breastfed at all and you have not had a menstrual period for 12 weeks after delivery. °· You have stopped breastfeeding and you have not had a menstrual period for 12 weeks after you stopped breastfeeding. °Get help right away if: °· You have: °? Pain that does not go away or does not get better with medicine. °? Chest pain. °? Difficulty breathing. °? Blurred vision or spots in your vision. °? Thoughts about hurting yourself or your baby. °· You develop pain in your abdomen or in one of your legs. °· You develop a severe headache. °· You faint. °· You bleed from your vagina so much that you fill two sanitary pads in one hour. °This information is not intended to replace advice given to you by your health care provider. Make sure you discuss any questions you have with your health care provider. °Document Released: 01/27/2000 Document Revised: 07/13/2015 Document Reviewed: 02/13/2015 °Elsevier Interactive Patient Education © 2019 [employer] ° °

## 2018-01-31 LAB — CBC
HCT: 34.5 % — ABNORMAL LOW (ref 36.0–46.0)
Hemoglobin: 11.5 g/dL — ABNORMAL LOW (ref 12.0–15.0)
MCH: 28.3 pg (ref 26.0–34.0)
MCHC: 33.3 g/dL (ref 30.0–36.0)
MCV: 84.8 fL (ref 80.0–100.0)
Platelets: 166 10*3/uL (ref 150–400)
RBC: 4.07 MIL/uL (ref 3.87–5.11)
RDW: 12.7 % (ref 11.5–15.5)
WBC: 15.7 10*3/uL — ABNORMAL HIGH (ref 4.0–10.5)
nRBC: 0 % (ref 0.0–0.2)

## 2018-01-31 LAB — RPR: RPR Ser Ql: NONREACTIVE

## 2018-01-31 MED ORDER — BENZOCAINE-MENTHOL 20-0.5 % EX AERO: 1.0000 "application " | INHALATION_SPRAY | CUTANEOUS | Status: DC | PRN

## 2018-01-31 MED ORDER — ONDANSETRON HCL 4 MG PO TABS: 4.0000 mg | ORAL_TABLET | ORAL | Status: DC | PRN

## 2018-01-31 MED ORDER — SIMETHICONE 80 MG PO CHEW: 80.0000 mg | CHEWABLE_TABLET | ORAL | Status: DC | PRN

## 2018-01-31 MED ORDER — WITCH HAZEL-GLYCERIN EX PADS: 1.0000 "application " | MEDICATED_PAD | CUTANEOUS | Status: DC | PRN

## 2018-01-31 MED ORDER — SENNOSIDES-DOCUSATE SODIUM 8.6-50 MG PO TABS
2.0000 | ORAL_TABLET | ORAL | Status: DC
  Administered 2018-01-31: 2 via ORAL
  Filled 2018-01-31 (×2): qty 2

## 2018-01-31 MED ORDER — ZOLPIDEM TARTRATE 5 MG PO TABS: 5.0000 mg | ORAL_TABLET | Freq: Every evening | ORAL | Status: DC | PRN

## 2018-01-31 MED ORDER — OXYCODONE-ACETAMINOPHEN 5-325 MG PO TABS: 2.0000 | ORAL_TABLET | ORAL | Status: DC | PRN

## 2018-01-31 MED ORDER — ACETAMINOPHEN 325 MG PO TABS
ORAL_TABLET | ORAL | Status: AC
  Filled 2018-01-31: qty 2

## 2018-01-31 MED ORDER — ACETAMINOPHEN 325 MG PO TABS
650.0000 mg | ORAL_TABLET | ORAL | Status: DC | PRN
  Administered 2018-01-31 – 2018-02-01 (×7): 650 mg via ORAL
  Filled 2018-01-31 (×6): qty 2

## 2018-01-31 MED ORDER — SODIUM CHLORIDE 0.9% FLUSH: 3.0000 mL | Freq: Two times a day (BID) | INTRAVENOUS | Status: DC

## 2018-01-31 MED ORDER — OXYCODONE-ACETAMINOPHEN 5-325 MG PO TABS: 1.0000 | ORAL_TABLET | ORAL | Status: DC | PRN

## 2018-01-31 MED ORDER — ONDANSETRON HCL 4 MG/2ML IJ SOLN: 4.0000 mg | INTRAMUSCULAR | Status: DC | PRN

## 2018-01-31 MED ORDER — COCONUT OIL OIL
1.0000 "application " | TOPICAL_OIL | Status: DC | PRN
  Filled 2018-01-31: qty 120

## 2018-01-31 MED ORDER — DIBUCAINE 1 % RE OINT: 1.0000 "application " | TOPICAL_OINTMENT | RECTAL | Status: DC | PRN

## 2018-01-31 MED ORDER — SODIUM CHLORIDE 0.9% FLUSH: 3.0000 mL | INTRAVENOUS | Status: DC | PRN

## 2018-01-31 MED ORDER — DIPHENHYDRAMINE HCL 25 MG PO CAPS: 25.0000 mg | ORAL_CAPSULE | Freq: Four times a day (QID) | ORAL | Status: DC | PRN

## 2018-01-31 MED ORDER — SODIUM CHLORIDE 0.9 % IV SOLN: 250.0000 mL | INTRAVENOUS | Status: DC | PRN

## 2018-01-31 NOTE — Progress Notes (Signed)
  Post Partum Day 1 Subjective: Doing well, no complaints.  Tolerating regular diet, pain with PO meds, voiding and ambulating without difficulty.  No CP SOB F/C N/V or leg pain No HA, change of vision, RUQ/epigastric pain  Objective: BP 108/65 (BP Location: Right Arm)   Pulse 65   Temp 98 F (36.7 C) (Oral)   Resp 18   Ht 5\' 4"  (1.626 m)   Wt 71.7 kg   LMP 05/02/2017 (Exact Date)   SpO2 98%   Breastfeeding  BMI 27.12 kg/m    Physical Exam:  General: NAD CV: RRR Pulm: nl effort, CTABL Lochia: moderate Uterine Fundus: fundus firm and below umbilicus DVT Evaluation: no cords, ttp LEs   Recent Labs    01/30/18 0538 01/31/18 0501  HGB 11.9* 11.5*  HCT 35.4* 34.5*  WBC 10.5 15.7*  PLT 177 166    Assessment/Plan: 22 y.o. G2P2002 postpartum day # 1  1. Continue routine postpartum care 2. B positive, Rubella Immune, Varicella Immune 3. TDAP status: received in the past year 4. Breastfeeding 5. Contraception: IUD   Tresea MallJane Nayel Purdy, CNM

## 2018-01-31 NOTE — Lactation Note (Signed)
This note was copied from a baby's chart. Lactation Consultation Note  Patient Name: Emily Mosley'T Date: 01/31/2018 Reason for consult: Initial assessment   Maternal Data    Feeding Feeding Type: Breast Fed  LATCH Score                   Interventions    Lactation Tools Discussed/Used Tools: Nipple Shields Nipple shield size: 20 Pump Review: Setup, frequency, and cleaning Initiated by:: Elvera Lennox RN IBCLC Date initiated:: 01/31/18   Consult Status  Mother states that she has sore nipples and it is very painful to breastfeed. She states that she has started to develop a blister on her left nipple. She states she has tried with and without the nipple shield. Mother also states that it was very painful when breastfeeding her first child and she quit after a short time. LC provided mother with a pump kit and helped her initiate pumping. Upon oral assessment, infant curls his bottom lip in often while sucking on a gloved finger. LC suggested that she pump every 2-3 hours for 15 minutes and try again to put baby to breast once her nipples has healed. LC gave parents information on local Pediatric dentist to evaluate for oral restrictions if they choose.    Elvera Lennox 15/72/6203, 11:34 AM

## 2018-01-31 NOTE — Anesthesia Postprocedure Evaluation (Signed)
Anesthesia Post Note  Patient: [name]  Procedure(s) Performed: AN AD HOC LABOR EPIDURAL  Patient location during evaluation: Mother Baby Anesthesia Type: Epidural Level of consciousness: awake and alert and oriented Pain management: pain level controlled Vital Signs Assessment: post-procedure vital signs reviewed and stable Respiratory status: spontaneous breathing Cardiovascular status: stable Postop Assessment: no backache, epidural receding, patient able to bend at knees, adequate PO intake and able to ambulate Anesthetic complications: no     Last Vitals:  Vitals:   01/31/18 0114 01/31/18 0325  BP: 115/68 126/75  Pulse: (!) 59 64  Resp: 18 18  Temp: 36.7 C 36.7 C  SpO2: 100% 99%    Last Pain:  Vitals:   01/31/18 0515  TempSrc:   PainSc: 5                  Zachary GeorgeWeatherly,  Gladys Deckard F

## 2018-02-01 NOTE — Lactation Note (Signed)
This note was copied from a baby's chart. Lactation Consultation Note  Patient Name: Emily Mosley EAVWU'JToday's Date: 02/01/2018 Reason for consult: Follow-up assessment;Mother's request;Difficult latch;Nipple pain/trauma  Assisted mom with latching him to right breast in football hold without nipple shield.  At first latch, he was shallow because he was turning in lower lip and was causing mom discomfort.  Demonstrated how to gently touch chin to flip out lower lip.  Mom reported it feeling much better.  Mom has a pump at home that she got from her insurance.  Encouraged mom to pump if he refuses to latch, feeds poorly or if he is causing a lot of pain and syringe feed.  Mom had excruciating pain when breast feeding last baby initially and was not sure if she could keep going, but went on to breast feed for 4 to 5 months.  Ascom direct line number given for mom to call once discharged if continued to have concerns or problems to set up out patient consult or ask any questions.     Maternal Data Formula Feeding for Exclusion: No Has patient been taught Hand Expression?: Yes Does the patient have breastfeeding experience prior to this delivery?: Yes  Feeding Feeding Type: Breast Fed  LATCH Score Latch: Repeated attempts needed to sustain latch, nipple held in mouth throughout feeding, stimulation needed to elicit sucking reflex.  Audible Swallowing: A few with stimulation  Type of Nipple: Everted at rest and after stimulation  Comfort (Breast/Nipple): Filling, red/small blisters or bruises, mild/mod discomfort  Hold (Positioning): Assistance needed to correctly position infant at breast and maintain latch.  LATCH Score: 6  Interventions Interventions: Breast feeding basics reviewed;Assisted with latch;Breast massage;Hand express;Pre-pump if needed;Reverse pressure;Breast compression;Adjust position;Support pillows;Position options;DEBP  Lactation Tools Discussed/Used WIC Program:  No(BCBS) Pump Review: Setup, frequency, and cleaning;Milk Storage;Other (comment) Initiated by:: Yates DecampAlycia Lilly, RN,IBCLC Date initiated:: 01/31/18   Consult Status Consult Status: PRN    Louis MeckelWilliams, Emily Mosley 02/01/2018, 1:39 PM

## 2018-02-06 ENCOUNTER — Encounter: Payer: Self-pay | Admitting: Obstetrics & Gynecology

## 2018-02-06 ENCOUNTER — Inpatient Hospital Stay: Admission: AD | Admit: 2018-02-06 | Payer: BLUE CROSS/BLUE SHIELD | Source: Home / Self Care

## 2018-02-06 ENCOUNTER — Ambulatory Visit (INDEPENDENT_AMBULATORY_CARE_PROVIDER_SITE_OTHER): Payer: BLUE CROSS/BLUE SHIELD | Admitting: Obstetrics & Gynecology

## 2018-02-06 ENCOUNTER — Telehealth: Payer: Self-pay

## 2018-02-06 ENCOUNTER — Ambulatory Visit: Payer: BLUE CROSS/BLUE SHIELD | Admitting: Maternal Newborn

## 2018-02-06 VITALS — BP 102/70 | Ht 64.0 in | Wt 143.0 lb

## 2018-02-06 DIAGNOSIS — O9229 Other disorders of breast associated with pregnancy and the puerperium: Secondary | ICD-10-CM | POA: Insufficient documentation

## 2018-02-06 MED ORDER — FLUCONAZOLE 150 MG PO TABS: 150.0000 mg | ORAL_TABLET | Freq: Once | ORAL | 3 refills | Status: AC

## 2018-02-06 MED ORDER — CEPHALEXIN 500 MG PO CAPS: 500.0000 mg | ORAL_CAPSULE | Freq: Four times a day (QID) | ORAL | 2 refills | Status: DC

## 2018-02-06 NOTE — Telephone Encounter (Signed)
Patient has a hard, painful spot under her arm for three days now.  She is breast feeding.  No fever.

## 2018-02-06 NOTE — Telephone Encounter (Signed)
Pt is scheduled w/JS today

## 2018-02-06 NOTE — Telephone Encounter (Signed)
LMVM TRC. 

## 2018-02-06 NOTE — Progress Notes (Signed)
HPI:     Emily Mosley is a 22 y.o. [id number] who is post partum 7 days, presents today for a problem visit.  She complains of breast lump on the rightside which she first noticed three days ago.  It has increased.  Associated symptoms include no redness to skin; lump is more in axilla; she had one prior to pregnancy but it has worsened this week.  Denies nipple discharge or skin changes.  No fever.  Prior Mammogram: no. Prior breast problems: No Family History: Breast Cancer-relatedfamily history is not on file.  PMHx: She  has a past medical history of Shoulder dystocia, delivered. Also,  has a past surgical history that includes Appendectomy; Eye muscle surgery; and Wisdom tooth extraction (2015)., family history includes Healthy in her father and mother; Thyroid disease in her maternal grandmother.,  reports that she has never smoked. She has never used smokeless tobacco. She reports that she does not drink alcohol or use drugs.  She has a current medication list which includes the following prescription(s): cephalexin, fluconazole, and prenate mini. Also, has No Known Allergies.  Review of Systems  Constitutional: Negative for chills, fever and malaise/fatigue.  HENT: Negative for congestion, sinus pain and sore throat.   Eyes: Negative for blurred vision and pain.  Respiratory: Negative for cough and wheezing.   Cardiovascular: Negative for chest pain and leg swelling.  Gastrointestinal: Negative for abdominal pain, constipation, diarrhea, heartburn, nausea and vomiting.  Genitourinary: Negative for dysuria, frequency, hematuria and urgency.  Musculoskeletal: Negative for back pain, joint pain, myalgias and neck pain.  Skin: Negative for itching and rash.  Neurological: Negative for dizziness, tremors and weakness.  Endo/Heme/Allergies: Does not bruise/bleed easily.  Psychiatric/Behavioral: Negative for depression. The patient is not nervous/anxious and does not have  insomnia.     Objective: BP 102/70   Ht 5\' 4"  (1.626 m)   Wt 143 lb (64.9 kg)   LMP 05/02/2017 (Exact Date)   BMI 24.55 kg/m  Physical Exam Constitutional:      General: She is not in acute distress.    Appearance: Normal appearance. She is well-developed.  Cardiovascular:     Rate and Rhythm: Normal rate and regular rhythm.     Pulses: Normal pulses.     Heart sounds: Normal heart sounds. No murmur. No friction rub. No gallop.   Pulmonary:     Effort: Pulmonary effort is normal.     Breath sounds: Normal breath sounds.  Chest:     Chest wall: No mass, tenderness or edema.     Breasts:        Right: Mass present. No inverted nipple, nipple discharge, skin change or tenderness.        Left: No inverted nipple, mass, nipple discharge, skin change or tenderness.     Comments: 3cm sl T axillary mass, mobile, no skin changes Musculoskeletal: Normal range of motion.  Lymphadenopathy:     Upper Body:     Right upper body: Axillary adenopathy present. No pectoral adenopathy.     Left upper body: No pectoral adenopathy.  Neurological:     Mental Status: She is alert and oriented to person, place, and time.  Skin:    General: Skin is warm and dry.     Findings: No abrasion, bruising, erythema, lesion or rash.  Psychiatric:        Speech: Speech normal.        Behavior: Behavior normal.        Judgment: Judgment  normal.  Vitals signs reviewed.   ASSESSMENT/PLAN: mastalgia and axillary mass c/w lymph node possible early mastitis Keflex Diflucan Plans IUD Monitor for fever, other mastitis sx's Cont to breast feed  Annamarie MajorPaul Avian Greenawalt, MD, Merlinda FrederickFACOG [facility], [facility] 02/06/2018  4:01 PM

## 2018-02-10 NOTE — Telephone Encounter (Signed)
FMLA adjustments, OK for 10 weeks

## 2018-02-12 HISTORY — PX: OTHER SURGICAL HISTORY: SHX169

## 2018-02-27 ENCOUNTER — Telehealth: Payer: Self-pay

## 2018-02-27 NOTE — Telephone Encounter (Signed)
Pt had redness and pain on one side of left breast; has fever of 100 last night; took ibuprophen; had chills.  Does she need to be seen?  Has been regularly pumping trying to get the milk out.  [phone]  Adv to be seen.  Appt made for 9:30am c PH.

## 2018-03-03 ENCOUNTER — Telehealth: Payer: Self-pay

## 2018-03-03 MED ORDER — FLUCONAZOLE 150 MG PO TABS: 150.0000 mg | ORAL_TABLET | Freq: Every day | ORAL | 1 refills | Status: DC

## 2018-03-03 NOTE — Telephone Encounter (Signed)
Pt is experiencing stabbing nipple pain and has dry skin around nipples.  She's pretty sure it's thrush.  Baby has white patches on his tongue.  Does she need to come in or can rx be sent without her being seen?  [phone]

## 2018-03-03 NOTE — Telephone Encounter (Signed)
Pt aware by vm. 

## 2018-03-03 NOTE — Telephone Encounter (Signed)
Rx sent to pharmacy for Diflucan daily for one week

## 2018-04-02 ENCOUNTER — Encounter: Payer: Self-pay | Admitting: Obstetrics & Gynecology

## 2018-04-02 ENCOUNTER — Ambulatory Visit (INDEPENDENT_AMBULATORY_CARE_PROVIDER_SITE_OTHER): Payer: BLUE CROSS/BLUE SHIELD | Admitting: Obstetrics & Gynecology

## 2018-04-02 ENCOUNTER — Other Ambulatory Visit (HOSPITAL_COMMUNITY)
Admission: RE | Admit: 2018-04-02 | Discharge: 2018-04-02 | Disposition: A | Discharge status: Home / Self Care | Payer: BLUE CROSS/BLUE SHIELD | Source: Ambulatory Visit | Attending: Obstetrics & Gynecology | Admitting: Obstetrics & Gynecology

## 2018-04-02 VITALS — BP 110/70 | Ht 64.0 in | Wt 143.0 lb

## 2018-04-02 DIAGNOSIS — Z30430 Encounter for insertion of intrauterine contraceptive device: Secondary | ICD-10-CM | POA: Diagnosis not present

## 2018-04-02 DIAGNOSIS — Z124 Encounter for screening for malignant neoplasm of cervix: Secondary | ICD-10-CM

## 2018-04-02 DIAGNOSIS — Z3202 Encounter for pregnancy test, result negative: Secondary | ICD-10-CM | POA: Diagnosis not present

## 2018-04-02 DIAGNOSIS — N926 Irregular menstruation, unspecified: Secondary | ICD-10-CM

## 2018-04-02 LAB — POCT URINE PREGNANCY: Preg Test, Ur: NEGATIVE

## 2018-04-02 NOTE — Patient Instructions (Signed)
Intrauterine Device Insertion, Care After    This sheet gives you information about how to care for yourself after your procedure. Your health care provider may also give you more specific instructions. If you have problems or questions, contact your health care provider.  What can I expect after the procedure?  After the procedure, it is common to have:  · Cramps and pain in the abdomen.  · Light bleeding (spotting) or heavier bleeding that is like your menstrual period. This may last for up to a few days.  · Lower back pain.  · Dizziness.  · Headaches.  · Nausea.  Follow these instructions at home:  · Before resuming sexual activity, check to make sure that you can feel the IUD string(s). You should be able to feel the end of the string(s) below the opening of your cervix. If your IUD string is in place, you may resume sexual activity.  ? If you had a hormonal IUD inserted more than 7 days after your most recent period started, you will need to use a backup method of birth control for 7 days after IUD insertion. Ask your health care provider whether this applies to you.  · Continue to check that the IUD is still in place by feeling for the string(s) after every menstrual period, or once a month.  · Take over-the-counter and prescription medicines only as told by your health care provider.  · Do not drive or use heavy machinery while taking prescription pain medicine.  · Keep all follow-up visits as told by your health care provider. This is important.  Contact a health care provider if:  · You have bleeding that is heavier or lasts longer than a normal menstrual cycle.  · You have a fever.  · You have cramps or abdominal pain that get worse or do not get better with medicine.  · You develop abdominal pain that is new or is not in the same area of earlier cramping and pain.  · You feel lightheaded or weak.  · You have abnormal or bad-smelling discharge from your vagina.  · You have pain during sexual  activity.  · You have any of the following problems with your IUD string(s):  ? The string bothers or hurts you or your sexual partner.  ? You cannot feel the string.  ? The string has gotten longer.  · You can feel the IUD in your vagina.  · You think you may be pregnant, or you miss your menstrual period.  · You think you may have an STI (sexually transmitted infection).  Get help right away if:  · You have flu-like symptoms.  · You have a fever and chills.  · You can feel that your IUD has slipped out of place.  Summary  · After the procedure, it is common to have cramps and pain in the abdomen. It is also common to have light bleeding (spotting) or heavier bleeding that is like your menstrual period.  · Continue to check that the IUD is still in place by feeling for the string(s) after every menstrual period, or once a month.  · Keep all follow-up visits as told by your health care provider. This is important.  · Contact your health care provider if you have problems with your IUD string(s), such as the string getting longer or bothering you or your sexual partner.  This information is not intended to replace advice given to you by your health care provider. Make   sure you discuss any questions you have with your health care provider.  Document Released: 09/27/2010 Document Revised: 12/21/2015 Document Reviewed: 12/21/2015  Elsevier Interactive Patient Education © 2019 Elsevier Inc.

## 2018-04-02 NOTE — Progress Notes (Signed)
  OBSTETRICS POSTPARTUM CLINIC PROGRESS NOTE  Subjective:     Emily Mosley is a 23 y.o. [phone] female who presents for a postpartum visit. She is 6 weeks postpartum following a Term pregnancy and delivery by Vaginal, no problems at delivery.  I have fully reviewed the prenatal and intrapartum course. Anesthesia: epidural.  Postpartum course has been complicated by uncomplicated.  Baby is feeding by Breast.  Bleeding: patient has not  resumed menses.  Bowel function is normal. Bladder function is normal.  Patient is not sexually active. Contraception method desired is IUD.  Postpartum depression screening: negative. [place] 2.  The following portions of the patient's history were reviewed and updated as appropriate: allergies, current medications, past family history, past medical history, past social history, past surgical history and problem list.  Review of Systems Pertinent items are noted in HPI.  Objective:    BP 110/70   Ht 5\' 4"  (1.626 m)   Wt 143 lb (64.9 kg)   LMP 05/02/2017 (Exact Date)   BMI 24.55 kg/m   General:  alert and no distress   Breasts:  inspection negative, no nipple discharge or bleeding, no masses or nodularity palpable  Lungs: clear to auscultation bilaterally  Heart:  regular rate and rhythm, S1, S2 normal, no murmur, click, rub or gallop  Abdomen: soft, non-tender; bowel sounds normal; no masses,  no organomegaly.     Vulva:  normal  Vagina: normal vagina, no discharge, exudate, lesion, or erythema  Cervix:  no cervical motion tenderness and no lesions  Corpus: normal size, contour, position, consistency, mobility, non-tender  Adnexa:  normal adnexa and no mass, fullness, tenderness  Rectal Exam: Not performed.          Assessment:  Post Partum Care visit 1. Irregular periods - POCT urine pregnancy  2. Postpartum care following vaginal delivery  3. Screening for cervical cancer - Cytology - PAP  4. Encounter for insertion of  mirena IUD   Plan:  See orders and Patient Instructions Contraceptive counseling for IUD Resume all normal activities Follow up in: 4 weeks or as needed.    IUD PROCEDURE NOTE:  Emily Mosley is a 23 y.o. [phone] here for IUD insertion. No GYN concerns.  Last pap smear was normal.  IUD Insertion Procedure Note Patient identified, informed consent performed, consent signed.   Discussed risks of irregular bleeding, cramping, infection, malpositioning or misplacement of the IUD outside the uterus which may require further procedure such as laparoscopy, risk of failure <1%. Time out was performed.  Urine pregnancy test negative.  A bimanual exam showed the uterus to be midposition.  Speculum placed in the vagina.  Cervix visualized.  Cleaned with Betadine x 2.  Grasped anteriorly with a single tooth tenaculum.  Uterus sounded to 7 cm.   IUD placed per manufacturer's recommendations.  Strings trimmed to 3 cm. Tenaculum was removed, good hemostasis noted.  Patient tolerated procedure well.   Patient was given post-procedure instructions.  She was advised to have backup contraception for one week.  Patient was also asked to check IUD strings periodically and follow up in 4 weeks for IUD check.  Annamarie Major, MD, [facility], [facility] 04/02/2018  2:41 PM

## 2018-04-04 LAB — CYTOLOGY - PAP
CHLAMYDIA, DNA PROBE: NEGATIVE
Diagnosis: NEGATIVE
Neisseria Gonorrhea: NEGATIVE

## 2018-06-26 ENCOUNTER — Telehealth: Payer: Self-pay

## 2018-06-26 NOTE — Telephone Encounter (Signed)
Pt states on triage line she has postponed the IUD insertion follow up due to COVID-19. She would like to go ahead and schedule that because she is also having issues with hair loss/fatigue and wants to have lab work. She realizes she is 5 months postpartum but this seems a little excessive. CB# [phone]. Pt aware RPH is out of the office today and will return tomorrow.

## 2018-06-27 NOTE — Telephone Encounter (Signed)
Please call and schedule iud appt with [facility]

## 2018-06-30 ENCOUNTER — Telehealth: Payer: Self-pay

## 2018-06-30 NOTE — Telephone Encounter (Signed)
Did you call pt to schedule

## 2018-06-30 NOTE — Telephone Encounter (Signed)
Needs iud appt with rph

## 2018-06-30 NOTE — Telephone Encounter (Signed)
Pt calling c/o called Thurs but no response.  Would like to have lab work done.  [phone]

## 2018-07-01 ENCOUNTER — Ambulatory Visit (INDEPENDENT_AMBULATORY_CARE_PROVIDER_SITE_OTHER): Payer: BLUE CROSS/BLUE SHIELD | Admitting: Obstetrics & Gynecology

## 2018-07-01 ENCOUNTER — Encounter: Payer: Self-pay | Admitting: Obstetrics & Gynecology

## 2018-07-01 ENCOUNTER — Other Ambulatory Visit: Payer: Self-pay

## 2018-07-01 VITALS — BP 90/60 | Ht 63.0 in | Wt 140.0 lb

## 2018-07-01 DIAGNOSIS — Z975 Presence of (intrauterine) contraceptive device: Secondary | ICD-10-CM

## 2018-07-01 DIAGNOSIS — Z30431 Encounter for routine checking of intrauterine contraceptive device: Secondary | ICD-10-CM

## 2018-07-01 DIAGNOSIS — R5383 Other fatigue: Secondary | ICD-10-CM

## 2018-07-01 NOTE — Progress Notes (Signed)
  History of Present Illness:  Emily Mosley is a 23 y.o. that had a Mirena IUD placed approximately 8 weeks ago. Since that time, she states that she has had no pain and occas spotting She does express concerns over fatigue and hair loss.  She is post partum and breast feeding some and infant sleeping and doing well  PMHx: She  has a past medical history of Shoulder dystocia, delivered. Also,  has a past surgical history that includes Appendectomy; Eye muscle surgery; and Wisdom tooth extraction (2015)., family history includes Healthy in her father and mother; Thyroid disease in her maternal grandmother.,  reports that she has never smoked. She has never used smokeless tobacco. She reports that she does not drink alcohol or use drugs. Current Meds  Medication Sig  . levonorgestrel (MIRENA) 20 MCG/24HR IUD 1 each by Intrauterine route once.  . Prenat-FeCbn-FeAsp-Meth-FA-DHA (PRENATE MINI) 18-0.6-0.4-350 MG CAPS Take 1 capsule by mouth daily.  .  Also, has No Known Allergies..  Review of Systems  All other systems reviewed and are negative.   Physical Exam:  BP 90/60   Ht 5\' 3"  (1.6 m)   Wt 140 lb (63.5 kg)   BMI 24.80 kg/m  Body mass index is 24.8 kg/m. Constitutional: Well nourished, well developed female in no acute distress.  Abdomen: diffusely non tender to palpation, non distended, and no masses, hernias Neuro: Grossly intact Psych:  Normal mood and affect.    Pelvic exam:  Two IUD strings present seen coming from the cervical os. EGBUS, vaginal vault and cervix: within normal limits  Assessment: IUD strings present in proper location; pt doing well  Plan: She was told to continue to use barrier contraception, in order to prevent any STIs, and to take a home pregnancy test or call us if she ever thinks she may be pregnant, and that her IUD expires in 5 years.  She was amenable to this plan and we will see her back in 1 year/PRN.  TSH and Hgb for fatigue.  A total  of 15 minutes were spent face-to-face with the patient during this encounter and over half of that time dealt with counseling and coordination of care.  Annamarie Major, MD, [facility] Frederick [facility], [facility] 07/01/2018  4:53 PM

## 2018-07-03 LAB — HEMOGLOBIN: Hemoglobin: 13.4 g/dL (ref 11.1–15.9)

## 2018-07-03 LAB — TSH: TSH: 2.1 u[IU]/mL (ref 0.450–4.500)

## 2018-09-29 ENCOUNTER — Telehealth: Payer: Self-pay

## 2018-09-29 NOTE — Telephone Encounter (Signed)
Pt calling c/o vaginal sxs of burning, itching, no pain c urination; a little bit of d/c; has tried OTC creams which did not help; very uncomfortable the last few days.  Can Diflucan be called in?  Does she need to be seen?  [phone]  Adv pt to be seen.  Offered appt today.  Pt states she will need to call back d/t child care.

## 2019-07-01 ENCOUNTER — Ambulatory Visit: Payer: BLUE CROSS/BLUE SHIELD | Admitting: Obstetrics and Gynecology

## 2019-07-08 ENCOUNTER — Ambulatory Visit (INDEPENDENT_AMBULATORY_CARE_PROVIDER_SITE_OTHER): Payer: BC Managed Care – PPO | Admitting: Advanced Practice Midwife

## 2019-07-08 ENCOUNTER — Encounter: Payer: Self-pay | Admitting: Advanced Practice Midwife

## 2019-07-08 ENCOUNTER — Other Ambulatory Visit: Payer: Self-pay

## 2019-07-08 VITALS — BP 117/73 | HR 60 | Wt 129.0 lb

## 2019-07-08 DIAGNOSIS — Z0000 Encounter for general adult medical examination without abnormal findings: Secondary | ICD-10-CM

## 2019-07-08 NOTE — Progress Notes (Signed)
Gynecology Annual Exam  PCP: [facility]  Chief Complaint:  Chief Complaint  Patient presents with  . Gynecologic Exam    History of Present Illness: Patient is a 24 y.o. [id number] presents for annual exam. The patient has no gyn complaints today. She reports daily fatigue and has always had normal lab work. We discussed need for adequate sleep, healthy diet, decrease stress for optimal hormone/adrenal function.   LMP: No LMP recorded. (Menstrual status: IUD). Menarche:13 Average Interval: irregular, spotting for 1-2 days every 2 weeks to every 2 months   Intermenstrual Bleeding: not applicable Postcoital Bleeding: no Dysmenorrhea: no  The patient is sexually active. She currently uses Mirena IUD for contraception. She denies dyspareunia.  The patient does occasionally perform self breast exams.  There is no notable family history of breast or ovarian cancer in her family.  The patient wears seatbelts: yes.  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 She admits healthy lifestyle diet. She admits needing to increase hydration with h2o. She has daily diet soda and has some caffeine in the form of pre-workout drink. She usually has about 6 hours of sleep.    The patient denies current symptoms of depression.    Review of Systems: Review of Systems  Constitutional: Positive for malaise/fatigue. Negative for chills and fever.  HENT: Negative for congestion, ear discharge, ear pain, hearing loss, sinus pain and sore throat.   Eyes: Negative for blurred vision and double vision.  Respiratory: Negative for cough, shortness of breath and wheezing.   Cardiovascular: Negative for chest pain, palpitations and leg swelling.  Gastrointestinal: Negative for abdominal pain, blood in stool, constipation, diarrhea, heartburn, melena, nausea and vomiting.  Genitourinary: Negative for  dysuria, flank pain, frequency, hematuria and urgency.  Musculoskeletal: Negative for back pain, joint pain and myalgias.  Skin: Negative for itching and rash.  Neurological: Negative for dizziness, tingling, tremors, sensory change, speech change, focal weakness, seizures, loss of consciousness, weakness and headaches.  Endo/Heme/Allergies: Negative for environmental allergies. Does not bruise/bleed easily.  Psychiatric/Behavioral: Negative for depression, hallucinations, memory loss, substance abuse and suicidal ideas. The patient is not nervous/anxious and does not have insomnia.     Past Medical History:  Patient Active Problem List   Diagnosis Date Noted  . Postpartum mastalgia 02/06/2018  . Postpartum care following vaginal delivery 02/01/2018  . Encounter for induction of labor 01/30/2018  . History of shoulder dystocia in prior pregnancy, currently pregnant in third trimester 12/11/2017  . Supervision of normal pregnancy 06/10/2017    Clinic [facility] Prenatal Labs  Dating LMP c/w 6wk [place] Blood type: B/Positive/-- (05/08 1012)   Genetic Screen 1 Screen:    AFP:     Quad:     NIPS: declines Antibody:Negative (05/08 1012)  Anatomic [place]  Rubella: 2.41 (05/08 1012) Varicella: Immune (05/08 0000)  GTT Early:               Third trimester: 57 RPR: Non Reactive (10/03 1115)   Rhogam  HBsAg: Negative (05/08 1012)   TDaP vaccine    declined                   Flu Shot: HIV: Non Reactive (10/03 1115)   Baby Food         Breast  UXL:[id number] (12/06 1116)  Contraception IUD Pap:  CBB     CS/VBAC    Support Person             Past Surgical History:  Past Surgical History:  Procedure Laterality Date  . APPENDECTOMY    . EYE MUSCLE SURGERY     AGE 60  . MIrena IUD  2020  . WISDOM TOOTH EXTRACTION  2015   ALL FOUR    Gynecologic History:  No LMP recorded. (Menstrual status: IUD). Contraception: IUD Last Pap: 1 year ago Results were:  no abnormalities    Obstetric History: [id number]  Family History:  Family History  Problem Relation Age of Onset  . Healthy Mother   . Healthy Father   . Thyroid disease Maternal Grandmother        HYPO     Social History:  Social History   Socioeconomic History  . Marital status: Married    Spouse name: Not on file  . Number of children: 2  . Years of education: 46  . Highest education level: Not on file  Occupational History  . Occupation: Teacher, adult education: [employer]  Tobacco Use  . Smoking status: Never Smoker  . Smokeless tobacco: Never Used  Substance and Sexual Activity  . Alcohol use: No  . Drug use: No  . Sexual activity: Yes    Birth control/protection: I.U.D.  Other Topics Concern  . Not on file  Social History Narrative  . Not on file   Social Determinants of Health   Financial Resource Strain:   . Difficulty of Paying Living Expenses:   Food Insecurity:   . Worried About Programme researcher, broadcasting/film/video in the Last Year:   . Barista in the Last Year:   Transportation Needs:   . Freight forwarder (Medical):   Marland Kitchen Lack of Transportation (Non-Medical):   Physical Activity:   . Days of Exercise per Week:   . Minutes of Exercise per Session:   Stress:   . Feeling of Stress :   Social Connections:   . Frequency of Communication with Friends and Family:   . Frequency of Social Gatherings with Friends and Family:   . Attends Religious Services:   . Active Member of Clubs or Organizations:   . Attends Banker Meetings:   Marland Kitchen Marital Status:   Intimate Partner Violence:   . Fear of Current or Ex-Partner:   . Emotionally Abused:   Marland Kitchen Physically Abused:   . Sexually Abused:     Allergies:  No Known Allergies  Medications: Prior to Admission medications   Medication Sig Start Date End Date Taking? Authorizing Provider  levonorgestrel (MIRENA) 20 MCG/24HR IUD 1 each by Intrauterine route once.   Yes [provider]   Prenat-FeCbn-FeAsp-Meth-FA-DHA (PRENATE MINI) 18-0.6-0.4-350 MG CAPS Take 1 capsule by mouth daily. 07/29/17  Yes Nadara Mustard, MD    Physical Exam Vitals: Blood pressure 117/73, pulse 60, weight 129 lb (58.5 kg)  General: NAD HEENT: normocephalic, anicteric Thyroid: no enlargement, no palpable nodules Pulmonary: No increased work of breathing, CTAB Cardiovascular: RRR, distal pulses 2+ Breast: Breast symmetrical, no tenderness, no palpable nodules or masses, no skin or nipple retraction present, no nipple discharge.  No axillary or supraclavicular lymphadenopathy. Abdomen: NABS, soft, non-tender, non-distended.  Umbilicus without lesions.  No hepatomegaly, splenomegaly or masses palpable. No evidence of hernia  Genitourinary: declined pelvic exam for no concerns/PAP interval/She has felt strings Extremities: no  edema, erythema, or tenderness Neurologic: Grossly intact Psychiatric: mood appropriate, affect full    Assessment: 24 y.o. G2P2002 routine annual exam  Plan: Problem List Items Addressed This Visit    None    Visit Diagnoses    Well woman exam without gynecological exam    -  Primary      1) 4) Gardasil Series discussed and if applicable offered to patient - Patient has not previously completed 3 shot series   2) STI screening  was offered and declined  3)  ASCCP guidelines and rationale discussed.  Patient opts for every 3 years screening interval  4) Contraception - the patient is currently using Mirena IUD.  She is happy with her current form of contraception and plans to continue We discussed safe sex practices to reduce her furture risk of STI's.    5) Fatigue: increase amount of sleep, wean off diet drinks  6) Return in about 1 year (around 07/07/2020) for annual established gyn.   Rod Can, [facility] 07/08/2019, 11:52 AM

## 2019-08-13 ENCOUNTER — Telehealth: Payer: Self-pay

## 2019-08-13 NOTE — Telephone Encounter (Signed)
Pt aware; unable to sched now; has already tried [facility].  Wants msg sent to provider she saw last.  Adv JEG is not in the office today but since pt already knows to be seen will sent msg to her but she probably won't see msg today.

## 2019-08-13 NOTE — Telephone Encounter (Signed)
Pt calling; is having same yeast inf sxs as in the past; gets them a lot; has tried preventive measures;  Has had sxs for a few days now. Wants rx.  [phone] Per prev msg sxs are burning and itching.

## 2019-08-14 ENCOUNTER — Other Ambulatory Visit: Payer: Self-pay | Admitting: Advanced Practice Midwife

## 2019-08-14 DIAGNOSIS — B3731 Acute candidiasis of vulva and vagina: Secondary | ICD-10-CM

## 2019-08-14 MED ORDER — FLUCONAZOLE 150 MG PO TABS: 150.0000 mg | ORAL_TABLET | Freq: Once | ORAL | 1 refills | Status: AC

## 2019-08-14 NOTE — Progress Notes (Signed)
Rx diflucan sent to patient pharmacy and MyChart message sent. She will need to be seen if symptoms don't resolve.

## 2019-11-11 ENCOUNTER — Ambulatory Visit: Payer: BC Managed Care – PPO | Admitting: Obstetrics and Gynecology

## 2019-11-17 NOTE — Progress Notes (Signed)
Medicine, [facility]   Chief Complaint  Patient presents with  . IUD removal    not interested in [facility]  . Breast Exam    LB tender x couple of days    HPI:      Emily Mosley is a 24 y.o. [id number] whose LMP was No LMP recorded. (Menstrual status: IUD)., presents today for breast tenderness and IUD removal. Pt notes 1 spot LT breast that is tender to touch for a couple days with SBE. No masses, erythema, nipple, d/c, trauma. Doesn't hurt if not touched. No hx of breast masses/previous breast imaging. No FH breast cancer. Drinks small amt caffeine.  Would like IUD removed for future conception. Taking PNVs.  Mirena placed 04/02/18. Has random bleeding couple times a month , no dysmenorrhea. She is sex active, with mild dyspareunia. Has issues with decreased libido.   Last annual 5/21  Past Medical History:  Diagnosis Date  . Shoulder dystocia, delivered     Past Surgical History:  Procedure Laterality Date  . APPENDECTOMY    . EYE MUSCLE SURGERY     AGE 39  . MIrena IUD  2020  . WISDOM TOOTH EXTRACTION  2015   ALL FOUR    Family History  Problem Relation Age of Onset  . Healthy Mother   . Healthy Father   . Thyroid disease Maternal Grandmother        HYPO     Social History   Socioeconomic History  . Marital status: Married    Spouse name: Not on file  . Number of children: 1  . Years of education: 5  . Highest education level: Not on file  Occupational History  . Occupation: Teacher, adult education: [employer]  Tobacco Use  . Smoking status: Never Smoker  . Smokeless tobacco: Never Used  Vaping Use  . Vaping Use: Never used  Substance and Sexual Activity  . Alcohol use: No  . Drug use: No  . Sexual activity: Yes    Birth control/protection: I.U.D.    Comment: Mirena  Other Topics Concern  . Not on file  Social History Narrative  . Not on file   Social Determinants of Health   Financial Resource Strain:   . Difficulty of Paying  Living Expenses: Not on file  Food Insecurity:   . Worried About Programme researcher, broadcasting/film/video in the Last Year: Not on file  . Ran Out of Food in the Last Year: Not on file  Transportation Needs:   . Lack of Transportation (Medical): Not on file  . Lack of Transportation (Non-Medical): Not on file  Physical Activity:   . Days of Exercise per Week: Not on file  . Minutes of Exercise per Session: Not on file  Stress:   . Feeling of Stress : Not on file  Social Connections:   . Frequency of Communication with Friends and Family: Not on file  . Frequency of Social Gatherings with Friends and Family: Not on file  . Attends Religious Services: Not on file  . Active Member of Clubs or Organizations: Not on file  . Attends Banker Meetings: Not on file  . Marital Status: Not on file  Intimate Partner Violence:   . Fear of Current or Ex-Partner: Not on file  . Emotionally Abused: Not on file  . Physically Abused: Not on file  . Sexually Abused: Not on file    Outpatient Medications Prior to Visit  Medication Sig Dispense Refill  . levonorgestrel (MIRENA) 20 MCG/24HR IUD 1 each by Intrauterine route once.    . Prenat-FeCbn-FeAsp-Meth-FA-DHA (PRENATE MINI) 18-0.6-0.4-350 MG CAPS Take 1 capsule by mouth daily. 30 capsule 11   No facility-administered medications prior to visit.      ROS:  Review of Systems  Constitutional: Positive for fatigue. Negative for fever.  Gastrointestinal: Negative for blood in stool, constipation, diarrhea, nausea and vomiting.  Genitourinary: Negative for dyspareunia, dysuria, flank pain, frequency, hematuria, urgency, vaginal bleeding, vaginal discharge and vaginal pain.  Musculoskeletal: Negative for back pain.  Skin: Negative for rash.   BREAST: pain   OBJECTIVE:   Vitals:  BP 110/80   Ht 5\' 4"  (1.626 m)   Wt 131 lb (59.4 kg)   Breastfeeding No   BMI 22.49 kg/m   Physical Exam Vitals reviewed.  Constitutional:      Appearance: She  is well-developed.  Pulmonary:     Effort: Pulmonary effort is normal.  Chest:     Breasts: Breasts are symmetrical.        Right: No inverted nipple, mass, nipple discharge, skin change or tenderness.        Left: Tenderness present. No inverted nipple, mass, nipple discharge or skin change.    Genitourinary:    General: Normal vulva.     Vagina: Normal.     Cervix: Normal.     Comments: IUD STRINGS IN CX OS Musculoskeletal:        General: Normal range of motion.     Cervical back: Normal range of motion.  Skin:    General: Skin is warm and dry.  Neurological:     General: No focal deficit present.     Mental Status: She is alert and oriented to person, place, and time.     Cranial Nerves: No cranial nerve deficit.  Psychiatric:        Mood and Affect: Mood normal.        Behavior: Behavior normal.        Thought Content: Thought content normal.        Judgment: Judgment normal.     IUD Removal Strings of IUD identified and grasped.  IUD removed with difficulty with Boseman forceps.  Pt tolerated this well.  IUD noted to be intact.   Assessment/Plan: Breast pain, left--For a couple days, no masses, tender to palpate, otherwise neg exam. Offered breast u/s vs watch and wait a few days. Pt prefers to watch for now. If sx persist next wk, pt to f/u for breast u/s. D/C caffeine.   Encounter for IUD removal--pt tolerated.   Cont PNVs. F/u prn NOB.    Return if symptoms worsen or fail to improve.  Khalifa Knecht B. Caresse Sedivy, PA-C 11/18/2019 10:35 AM

## 2019-11-17 NOTE — Patient Instructions (Signed)
I value your feedback and entrusting us with your care. If you get a [place] patient survey, I would appreciate you taking the time to let us know about your experience today. Thank you!  As of January 22, 2019, your lab results will be released to your MyChart immediately, before I even have a chance to see them. Please give me time to review them and contact you if there are any abnormalities. Thank you for your patience.  

## 2019-11-18 ENCOUNTER — Ambulatory Visit (INDEPENDENT_AMBULATORY_CARE_PROVIDER_SITE_OTHER): Payer: BC Managed Care – PPO | Admitting: Obstetrics and Gynecology

## 2019-11-18 ENCOUNTER — Encounter: Payer: Self-pay | Admitting: Obstetrics and Gynecology

## 2019-11-18 ENCOUNTER — Other Ambulatory Visit: Payer: Self-pay

## 2019-11-18 VITALS — BP 110/80 | Ht 64.0 in | Wt 131.0 lb

## 2019-11-18 DIAGNOSIS — Z30432 Encounter for removal of intrauterine contraceptive device: Secondary | ICD-10-CM | POA: Diagnosis not present

## 2019-11-18 DIAGNOSIS — N644 Mastodynia: Secondary | ICD-10-CM

## 2019-12-17 IMAGING — US US AXILLARY RIGHT
1 series · 12 of 12 positions shown · non-contrast
Comparison: None.

CLINICAL DATA: Axillary palpable abnormality

EXAM:
ULTRASOUND RIGHT UPPER EXTREMITY LIMITED
TECHNIQUE: Ultrasound examination of the upper extremity soft tissues was
performed in the area of clinical concern.

[Series 1: us axillary right · 0.07mm/px · 12 of 12 slices shown]
[im 1/12]
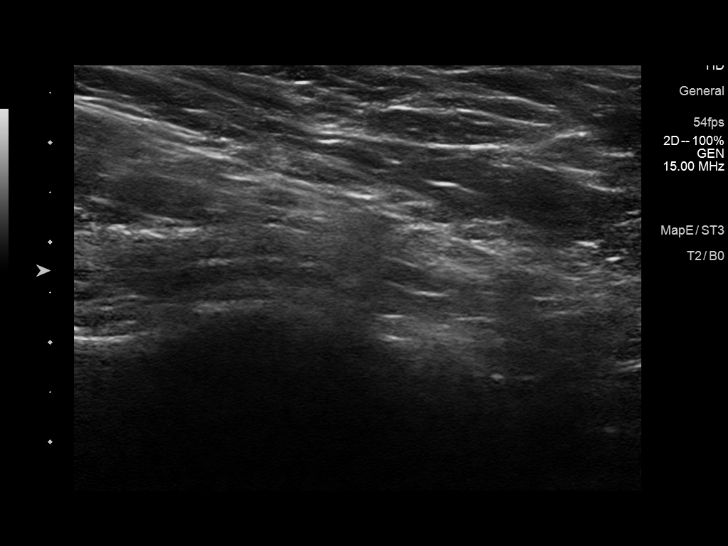
[im 2/12]
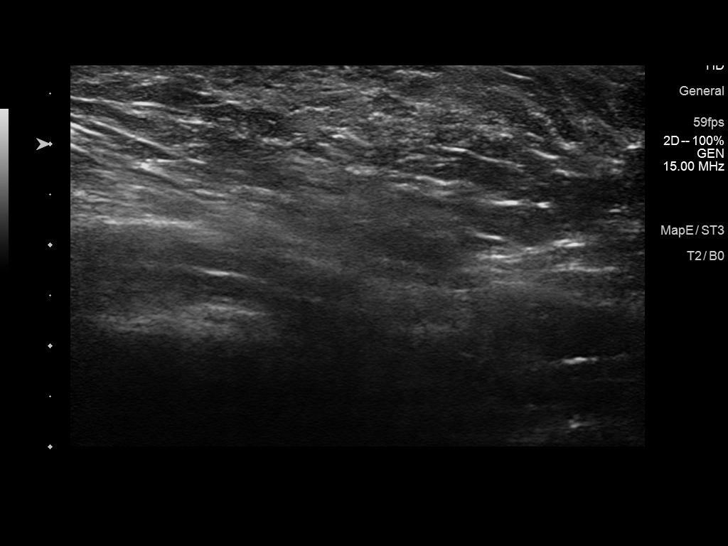
[im 3/12]
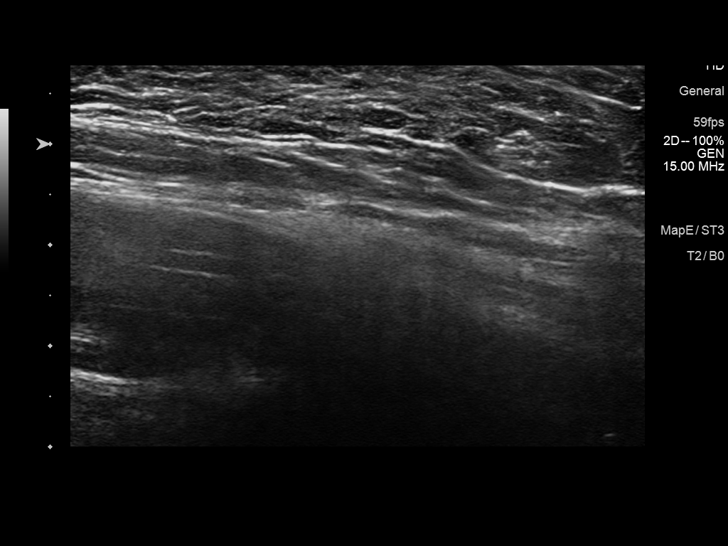
[im 4/12]
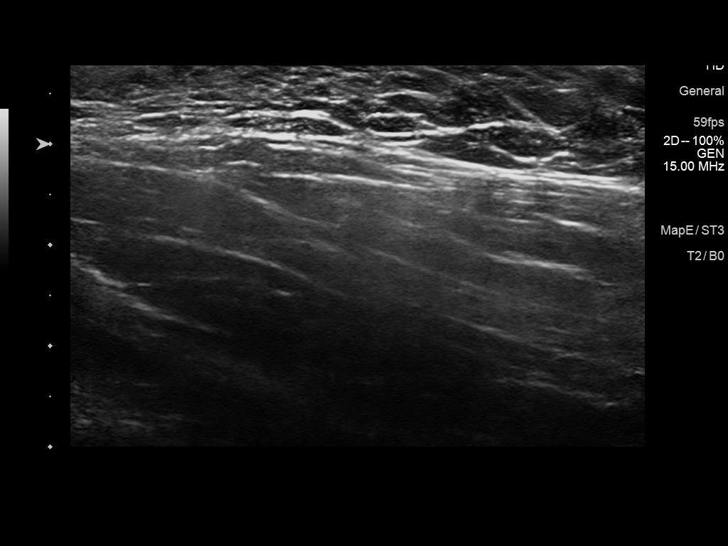
[im 5/12]
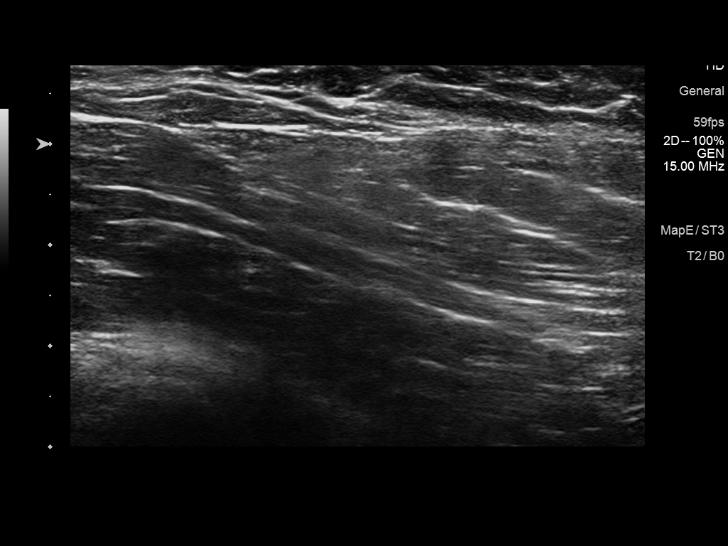
[im 6/12]
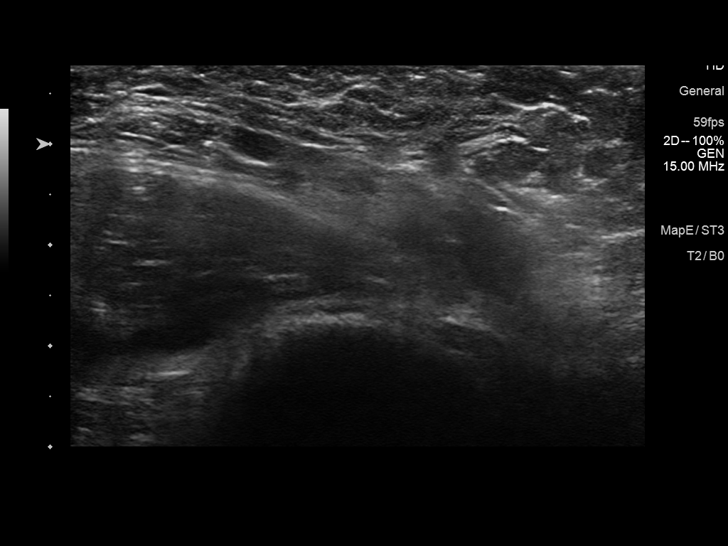
[im 7/12]
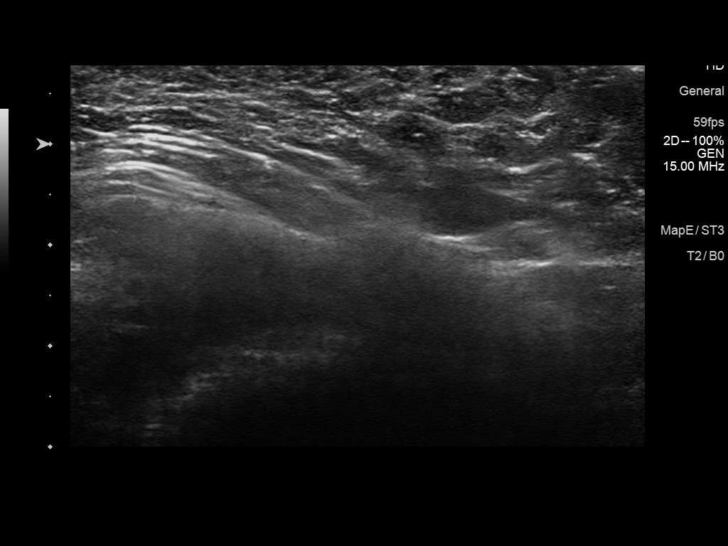
[im 8/12]
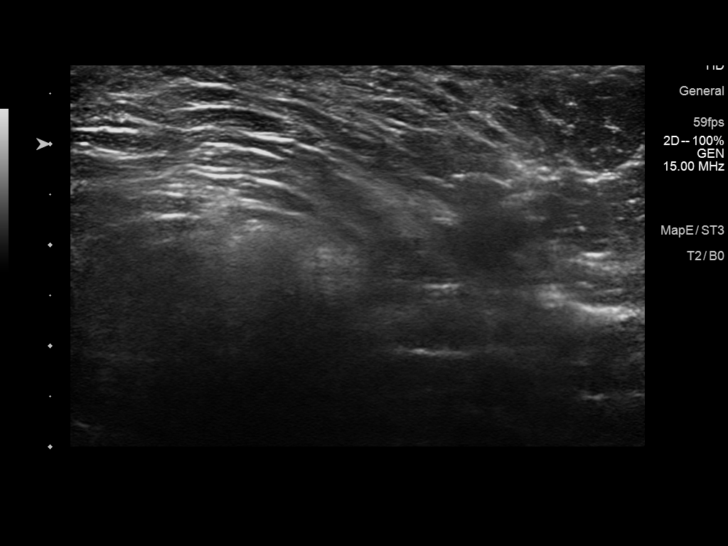
[im 9/12]
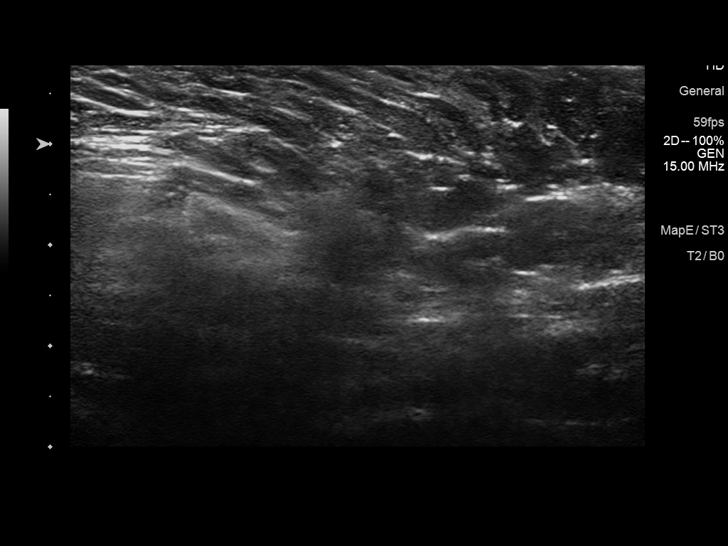
[im 10/12]
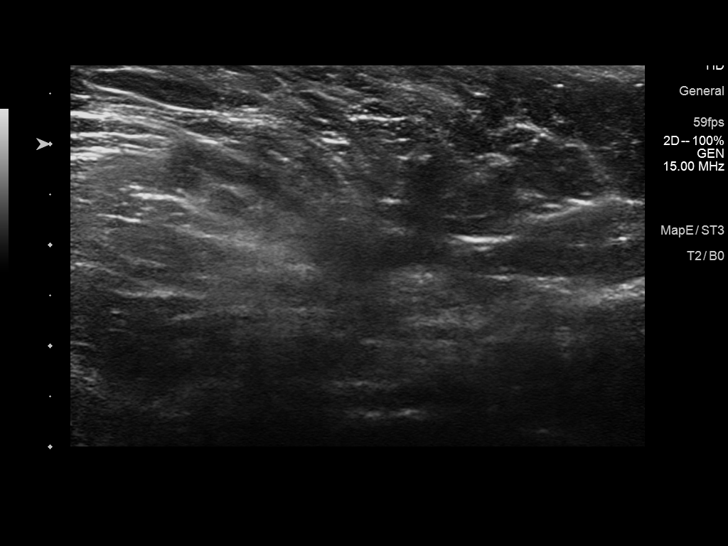
[im 11/12]
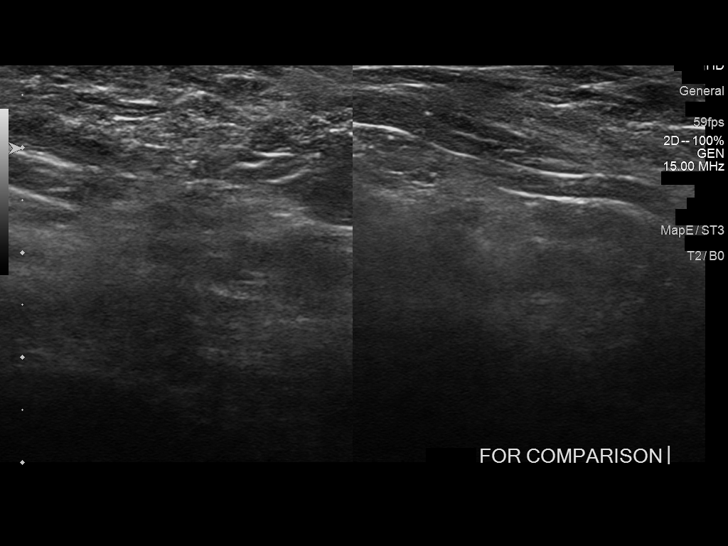
[im 12/12]
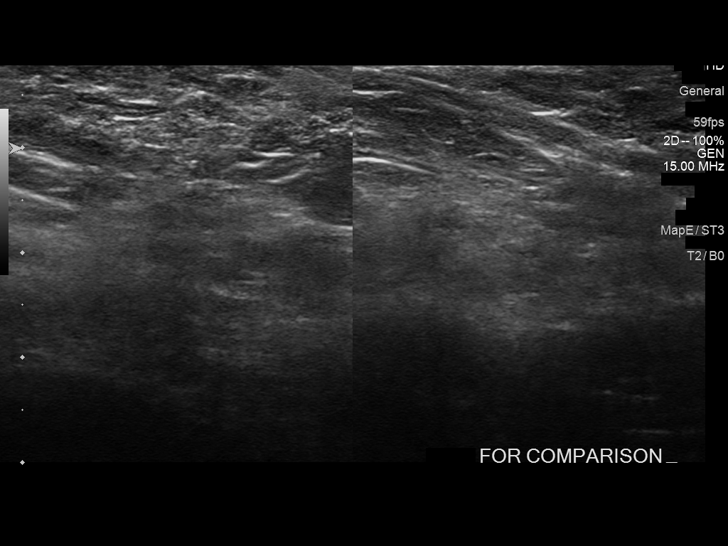

[12 of 12 positions shown; findings below may reference images not displayed]

FINDINGS: Limited superficial soft tissue ultrasound performed right axillary
area of concern. In this region, there is no significant superficial
soft tissue mass, cyst, fluid collection, hematoma, abscess, or
adenopathy.
IMPRESSION: No significant right axillary soft tissue abnormality by ultrasound.

## 2020-02-13 NOTE — L&D Delivery Note (Signed)
Delivery Note At 6:36 PM a viable female was delivered via Vaginal, Spontaneous (Presentation: Right Occiput Anterior).  APGAR: 8, 9; weight pending.   Placenta status: Spontaneous, Intact.  Cord: 3 vessels with the following complications: None.  Cord pH: n/a  Anesthesia: Epidural Episiotomy: None Lacerations: None Suture Repair:  n/a Est. Blood Loss (mL):  550, Quantitative Blood Loss pending  Mom to postpartum.  Baby to Couplet care / Skin to Skin.  Called to see patient.  Mom pushed to deliver a viable female infant.  The head followed by shoulders, which delivered without difficulty, and the rest of the body.  A triple nuchal cord noted and delivered through at the perineum.  Baby to mom's chest.  Cord clamped and cut after > 1 min delay.  No cord blood obtained.  Placenta delivered spontaneously, intact, with a 3-vessel cord.  No vaginal, cervical, or perineal lacerations. All counts correct.  Hemostasis obtained with IV pitocin and fundal massage. EBL 550 mL.  QBL pending.  Thomasene Mohair, MD 10/12/2020, 6:56 PM

## 2020-03-14 DIAGNOSIS — F419 Anxiety disorder, unspecified: Secondary | ICD-10-CM | POA: Insufficient documentation

## 2020-06-08 DIAGNOSIS — N393 Stress incontinence (female) (male): Secondary | ICD-10-CM | POA: Insufficient documentation

## 2020-07-04 ENCOUNTER — Ambulatory Visit (INDEPENDENT_AMBULATORY_CARE_PROVIDER_SITE_OTHER): Payer: BC Managed Care – PPO | Admitting: Obstetrics & Gynecology

## 2020-07-04 ENCOUNTER — Other Ambulatory Visit (HOSPITAL_COMMUNITY)
Admission: RE | Admit: 2020-07-04 | Discharge: 2020-07-04 | Disposition: A | Discharge status: Home / Self Care | Payer: BC Managed Care – PPO | Source: Ambulatory Visit | Attending: Obstetrics & Gynecology | Admitting: Obstetrics & Gynecology

## 2020-07-04 ENCOUNTER — Encounter: Payer: Self-pay | Admitting: Obstetrics & Gynecology

## 2020-07-04 ENCOUNTER — Other Ambulatory Visit: Payer: Self-pay

## 2020-07-04 VITALS — BP 100/60 | Wt 149.0 lb

## 2020-07-04 DIAGNOSIS — Z3480 Encounter for supervision of other normal pregnancy, unspecified trimester: Secondary | ICD-10-CM

## 2020-07-04 DIAGNOSIS — Z113 Encounter for screening for infections with a predominantly sexual mode of transmission: Secondary | ICD-10-CM | POA: Insufficient documentation

## 2020-07-04 DIAGNOSIS — Z131 Encounter for screening for diabetes mellitus: Secondary | ICD-10-CM

## 2020-07-04 DIAGNOSIS — Z3492 Encounter for supervision of normal pregnancy, unspecified, second trimester: Secondary | ICD-10-CM

## 2020-07-04 DIAGNOSIS — Z3A24 24 weeks gestation of pregnancy: Secondary | ICD-10-CM

## 2020-07-04 HISTORY — DX: Encounter for supervision of other normal pregnancy, unspecified trimester: Z34.80

## 2020-07-04 NOTE — Progress Notes (Signed)
07/04/2020   Chief Complaint: Missed period  Transfer of Care Patient: from [facility] Prior deliveries at [facility] [place]  History of Present Illness: Ms. Peake is a 25 y.o. [id number] [redacted]w[redacted]d based on Patient's last menstrual period was 01/12/2020. with an Estimated Date of Delivery: 10/18/20, with the above CC.   Her periods were: regular periods every 28 days She was using no method when she conceived.  On any different medications around the time she conceived/early pregnancy: No  History of varicella: Yes   ROS: A 12-point review of systems was performed and negative, except as stated in the above HPI.  OBGYN History: As per HPI. OB History  Gravida Para Term Preterm AB Living  3 2 2     2   SAB IAB Ectopic Multiple Live Births        0 2    # Outcome Date GA Lbr Len/2nd Weight Sex Delivery Anes PTL Lv  3 Current           2 Term 01/30/18 [redacted]w[redacted]d / 00:08 7 lb 10.1 oz (3.46 kg) M Vag-Spont EPI  LIV  1 Term 03/24/14 [redacted]w[redacted]d  9 lb 3.2 oz (4.173 kg) M Vag-Spont   LIV     Birth Comments: SHOULDER DYSTOCIA    Any issues with any prior pregnancies: no Any prior children are healthy, doing well, without any problems or issues: yes History of pap smears: Yes. Last pap smear 2020. Abnormal: no  History of STIs: No   Past Medical History: Past Medical History:  Diagnosis Date  . Shoulder dystocia, delivered     Past Surgical History: Past Surgical History:  Procedure Laterality Date  . APPENDECTOMY    . EYE MUSCLE SURGERY     AGE 27  . MIrena IUD  2020  . WISDOM TOOTH EXTRACTION  2015   ALL FOUR    Family History:  Family History  Problem Relation Age of Onset  . Healthy Mother   . Healthy Father   . Thyroid disease Maternal Grandmother        HYPO    She denies any female cancers, bleeding or blood clotting disorders.  She denies any history of mental retardation, birth defects or genetic disorders in her or the FOB's history  Social History:  Social History    Socioeconomic History  . Marital status: Married    Spouse name: Not on file  . Number of children: 1  . Years of education: 20  . Highest education level: Not on file  Occupational History  . Occupation: 19: [employer]  Tobacco Use  . Smoking status: Never Smoker  . Smokeless tobacco: Never Used  Vaping Use  . Vaping Use: Never used  Substance and Sexual Activity  . Alcohol use: No  . Drug use: No  . Sexual activity: Yes    Birth control/protection: I.U.D.    Comment: Mirena  Other Topics Concern  . Not on file  Social History Narrative  . Not on file   Social Determinants of Health   Financial Resource Strain: Not on file  Food Insecurity: Not on file  Transportation Needs: Not on file  Physical Activity: Not on file  Stress: Not on file  Social Connections: Not on file  Intimate Partner Violence: Not on file   Any pets in the household: no  Allergy: No Known Allergies  Current Outpatient Medications:  Current Outpatient Medications:  .  Prenat-FeCbn-FeAsp-Meth-FA-DHA (PRENATE MINI) 18-0.6-0.4-350 MG  CAPS, Take 1 capsule by mouth daily., Disp: 30 capsule, Rfl: 11   Physical Exam:   BP 100/60   Wt 149 lb (67.6 kg)   LMP 01/12/2020   BMI 25.58 kg/m  Body mass index is 25.58 kg/m. Constitutional: Well nourished, well developed female in no acute distress.  Neck:  Supple, normal appearance, and no thyromegaly  Cardiovascular: S1, S2 normal, no murmur, rub or gallop, regular rate and rhythm Respiratory:  Clear to auscultation bilateral. Normal respiratory effort Abdomen: positive bowel sounds and no masses, hernias; diffusely non tender to palpation, non distended FHTs 150s FH 27 Neuro/Psych:  Normal mood and affect.  Skin:  Warm and dry.  Lymphatic:  No inguinal lymphadenopathy.   Assessment: Ms. Fischetti is a 25 y.o. [id number] [redacted]w[redacted]d based on Patient's last menstrual period was 01/12/2020. with an Estimated Date of Delivery: 10/18/20,   for Transfer of prenatal care.  Plan:  1) Avoid alcoholic beverages. 2) Patient encouraged not to smoke.  3) Discontinue the use of all non-medicinal drugs and chemicals.  4) Take prenatal vitamins daily.  5) Seatbelt use advised 6) Nutrition, food safety (fish, cheese advisories, and high nitrite foods) and exercise discussed. 7) Hospital and practice style delivering at [facility] discussed  8) Patient is asked about travel to areas at risk for the Zika virus, and counseled to avoid travel and exposure to mosquitoes or sexual partners who may have themselves been exposed to the virus. Testing is discussed, and will be ordered as appropriate.  9) Childbirth classes at [facility] advised 10) Glucola nv   The following were addressed during this visit:  Breastfeeding Education - Early initiation of breastfeeding  - The importance of exclusive breastfeeding  - Risks of giving your baby anything other than breast milk if you are breastfeeding  - Nonpharmacological pain relief methods for labor  - The importance of early skin-to-skin contact  - Rooming-in on a 24-hour basis  - Feeding on demand or baby-led feeding  - Frequent feeding to help assure optimal milk production  - Effective positioning and attachment  - Exclusive breastfeeding for the first 6 months  - Individualized Education  Also, Plans to breast feed Plans Nexplanon or POP for contraception  Problem list reviewed and updated.  Annamarie Major, MD, [facility], [facility] 07/04/2020  9:28 AM

## 2020-07-04 NOTE — Patient Instructions (Signed)
Thank you for choosing [facility]. As part of our ongoing efforts to improve patient experience, we would appreciate your feedback. Please fill out the short survey that you will receive by mail or MyChart. Your opinion is important to us! -Dr Tucker Minter  Second Trimester of Pregnancy  The second trimester of pregnancy is from week 13 through week 27. This is months 4 through 6 of pregnancy. The second trimester is often a time when you feel your best. Your body has adjusted to being pregnant, and you begin to feel better physically. During the second trimester:  Morning sickness has lessened or stopped completely.  You may have more energy.  You may have an increase in appetite. The second trimester is also a time when the unborn baby (fetus) is growing rapidly. At the end of the sixth month, the fetus may be up to 12 inches long and weigh about 1 pounds. You will likely begin to feel the baby move (quickening) between 16 and 20 weeks of pregnancy. Body changes during your second trimester Your body continues to go through many changes during your second trimester. The changes vary and generally return to normal after the baby is born. Physical changes  Your weight will continue to increase. You will notice your lower abdomen bulging out.  You may begin to get stretch marks on your hips, abdomen, and breasts.  Your breasts will continue to grow and to become tender.  Dark spots or blotches (chloasma or mask of pregnancy) may develop on your face.  A dark line from your belly button to the pubic area (linea nigra) may appear.  You may have changes in your hair. These can include thickening of your hair, rapid growth, and changes in texture. Some people also have hair loss during or after pregnancy, or hair that feels dry or thin. Health changes  You may develop headaches.  You may have heartburn.  You may develop constipation.  You may develop hemorrhoids or swollen, bulging  veins (varicose veins).  Your gums may bleed and may be sensitive to brushing and flossing.  You may urinate more often because the fetus is pressing on your bladder.  You may have back pain. This is caused by: ? Weight gain. ? Pregnancy hormones that are relaxing the joints in your pelvis. ? A shift in weight and the muscles that support your balance. Follow these instructions at home: Medicines  Follow your health care provider's instructions regarding medicine use. Specific medicines may be either safe or unsafe to take during pregnancy. Do not take any medicines unless approved by your health care provider.  Take a prenatal vitamin that contains at least 600 micrograms (mcg) of folic acid. Eating and drinking  Eat a healthy diet that includes fresh fruits and vegetables, whole grains, good sources of protein such as meat, eggs, or tofu, and low-fat dairy products.  Avoid raw meat and unpasteurized juice, milk, and cheese. These carry germs that can harm you and your baby.  You may need to take these actions to prevent or treat constipation: ? Drink enough fluid to keep your urine pale yellow. ? Eat foods that are high in fiber, such as beans, whole grains, and fresh fruits and vegetables. ? Limit foods that are high in fat and processed sugars, such as fried or sweet foods. Activity  Exercise only as directed by your health care provider. Most people can continue their usual exercise routine during pregnancy. Try to exercise for 30 minutes at least   5 days a week. Stop exercising if you develop contractions in your uterus.  Stop exercising if you develop pain or cramping in the lower abdomen or lower back.  Avoid exercising if it is very hot or humid or if you are at a high altitude.  Avoid heavy lifting.  If you choose to, you may have sex unless your health care provider tells you not to. Relieving pain and discomfort  Wear a supportive bra to prevent discomfort from  breast tenderness.  Take warm sitz baths to soothe any pain or discomfort caused by hemorrhoids. Use hemorrhoid cream if your health care provider approves.  Rest with your legs raised (elevated) if you have leg cramps or low back pain.  If you develop varicose veins: ? Wear support hose as told by your health care provider. ? Elevate your feet for 15 minutes, 3-4 times a day. ? Limit salt in your diet. Safety  Wear your seat belt at all times when driving or riding in a car.  Talk with your health care provider if someone is verbally or physically abusive to you. Lifestyle  Do not use hot tubs, steam rooms, or saunas.  Do not douche. Do not use tampons or scented sanitary pads.  Avoid cat litter boxes and soil used by cats. These carry germs that can cause birth defects in the baby and possibly loss of the fetus by miscarriage or stillbirth.  Do not use herbal remedies, alcohol, illegal drugs, or medicines that are not approved by your health care provider. Chemicals in these products can harm your baby.  Do not use any products that contain nicotine or tobacco, such as cigarettes, e-cigarettes, and chewing tobacco. If you need help quitting, ask your health care provider. General instructions  During a routine prenatal visit, your health care provider will do a physical exam and other tests. He or she will also discuss your overall health. Keep all follow-up visits. This is important.  Ask your health care provider for a referral to a local prenatal education class.  Ask for help if you have counseling or nutritional needs during pregnancy. Your health care provider can offer advice or refer you to specialists for help with various needs. Where to find more information  American Pregnancy Association: americanpregnancy.org  American College of Obstetricians and Gynecologists: acog.org/en/Womens%20Health/Pregnancy  Office on Women's Health: womenshealth.gov/pregnancy Contact  a health care provider if you have:  A headache that does not go away when you take medicine.  Vision changes or you see spots in front of your eyes.  Mild pelvic cramps, pelvic pressure, or nagging pain in the abdominal area.  Persistent nausea, vomiting, or diarrhea.  A bad-smelling vaginal discharge or foul-smelling urine.  Pain when you urinate.  Sudden or extreme swelling of your face, hands, ankles, feet, or legs.  A fever. Get help right away if you:  Have fluid leaking from your vagina.  Have spotting or bleeding from your vagina.  Have severe abdominal cramping or pain.  Have difficulty breathing.  Have chest pain.  Have fainting spells.  Have not felt your baby move for the time period told by your health care provider.  Have new or increased pain, swelling, or redness in an arm or leg. Summary  The second trimester of pregnancy is from week 13 through week 27 (months 4 through 6).  Do not use herbal remedies, alcohol, illegal drugs, or medicines that are not approved by your health care provider. Chemicals in these products can   harm your baby.  Exercise only as directed by your health care provider. Most people can continue their usual exercise routine during pregnancy.  Keep all follow-up visits. This is important. This information is not intended to replace advice given to you by your health care provider. Make sure you discuss any questions you have with your health care provider. Document Revised: 07/08/2019 Document Reviewed: 05/14/2019 Elsevier Patient Education  2021 Elsevier Inc.  

## 2020-07-05 LAB — GC/CHLAMYDIA PROBE AMP (~~LOC~~) NOT AT ARMC
Chlamydia: NEGATIVE
Comment: NEGATIVE
Comment: NORMAL
Neisseria Gonorrhea: NEGATIVE

## 2020-08-02 ENCOUNTER — Other Ambulatory Visit: Payer: BC Managed Care – PPO

## 2020-08-02 ENCOUNTER — Other Ambulatory Visit: Payer: Self-pay | Admitting: Obstetrics & Gynecology

## 2020-08-02 ENCOUNTER — Other Ambulatory Visit: Payer: Self-pay

## 2020-08-02 ENCOUNTER — Ambulatory Visit (INDEPENDENT_AMBULATORY_CARE_PROVIDER_SITE_OTHER): Payer: BC Managed Care – PPO | Admitting: Obstetrics and Gynecology

## 2020-08-02 ENCOUNTER — Encounter: Payer: Self-pay | Admitting: Obstetrics and Gynecology

## 2020-08-02 VITALS — BP 108/70 | Ht 64.0 in

## 2020-08-02 DIAGNOSIS — Z131 Encounter for screening for diabetes mellitus: Secondary | ICD-10-CM

## 2020-08-02 DIAGNOSIS — O09293 Supervision of pregnancy with other poor reproductive or obstetric history, third trimester: Secondary | ICD-10-CM

## 2020-08-02 DIAGNOSIS — Z3A29 29 weeks gestation of pregnancy: Secondary | ICD-10-CM

## 2020-08-02 DIAGNOSIS — O26893 Other specified pregnancy related conditions, third trimester: Secondary | ICD-10-CM

## 2020-08-02 DIAGNOSIS — Z3483 Encounter for supervision of other normal pregnancy, third trimester: Secondary | ICD-10-CM

## 2020-08-02 DIAGNOSIS — R102 Pelvic and perineal pain: Secondary | ICD-10-CM

## 2020-08-02 LAB — POCT URINALYSIS DIPSTICK OB
Glucose, UA: NEGATIVE
POC,PROTEIN,UA: NEGATIVE

## 2020-08-02 NOTE — Progress Notes (Signed)
    Routine Prenatal Care Visit  Subjective  Emily Mosley is a 25 y.o. G3P2002 at [redacted]w[redacted]d being seen today for ongoing prenatal care.  She is currently monitored for the following issues for this low-risk pregnancy and has History of shoulder dystocia in prior pregnancy, currently pregnant in third trimester and Supervision of other normal pregnancy, antepartum on their problem list.  ----------------------------------------------------------------------------------- Patient reports  pelvic discomfort .   Contractions: Not present. Vag. Bleeding: None.  Movement: Present. Denies leaking of fluid.  ----------------------------------------------------------------------------------- The following portions of the patient's history were reviewed and updated as appropriate: allergies, current medications, past family history, past medical history, past social history, past surgical history and problem list. Problem list updated.   Objective  Blood pressure 108/70, height 5\' 4"  (1.626 m), last menstrual period 01/12/2020. Pregravid weight 130 lb (59 kg) Total Weight Gain 19 lb (8.618 kg) Urinalysis:      Fetal Status: Fetal Heart Rate (bpm): 140 Fundal Height: 30 cm Movement: Present     General:  Alert, oriented and cooperative. Patient is in no acute distress.  Skin: Skin is warm and dry. No rash noted.   Cardiovascular: Normal heart rate noted  Respiratory: Normal respiratory effort, no problems with respiration noted  Abdomen: Soft, gravid, appropriate for gestational age. Pain/Pressure: Absent     Pelvic:  Cervical exam deferred        Extremities: Normal range of motion.  Edema: None  Mental Status: Normal mood and affect. Normal behavior. Normal judgment and thought content.     Assessment   25 y.o. 25 at [redacted]w[redacted]d by  10/18/2020, by Last Menstrual Period presenting for work-in prenatal visit  Plan   pregnancy2  Problems (from 01/24/20 to present)     Problem Noted  Resolved   Supervision of other normal pregnancy, antepartum 07/04/2020 by 07/06/2020, MD No   Overview Addendum 08/02/2020 11:28 AM by 08/04/2020, MD    Clinic [facility] first, then [facility] Prenatal Labs  Dating US/LMP Blood type:   B+  Genetic Screen Declined Antibody: Neg  Anatomic [facility], nml Rubella:   Imm Varicella: Imm  GTT   Third trimester:  RPR:   NR  Rhogam n/a HBsAg:   NR  TDaP vaccine                       Flu Shot: HIV:   Nrg  Baby Food Breast GBS:   Contraception POP vs Nexplanon Pap:due PP  CBB  no   CS/VBAC n/a   Support Person Husband     History of shoulder dystocia            Referral to pelvic PT. Provided with information regarding prolapse which she feels like she experiences. Declines pelvic exam today.   Gestational age appropriate obstetric precautions including but not limited to vaginal bleeding, contractions, leaking of fluid and fetal movement were reviewed in detail with the patient.    Return in about 2 weeks (around 08/16/2020) for ROB in person.  10/17/2020 MD [facility], [facility] 08/02/2020, 11:29 AM

## 2020-08-02 NOTE — Addendum Note (Signed)
Addended by: Clement Husbands A on: 08/02/2020 11:54 AM   Modules accepted: Orders

## 2020-08-02 NOTE — Patient Instructions (Signed)
[email or web address]   [email or web address]">  Third Trimester of Pregnancy  The third trimester of pregnancy is from week 28 through week 40. This is months 7 through 9. The third trimester is a time when the unborn baby (fetus) is growing rapidly. At the end of the ninth month, the fetus is about 20inches long and weighs 6-10 pounds. Body changes during your third trimester During the third trimester, your body will continue to go through many changes.The changes vary and generally return to normal after your baby is born. Physical changes Your weight will continue to increase. You can expect to gain 25-35 pounds (11-16 kg) by the end of the pregnancy if you begin pregnancy at a normal weight. If you are underweight, you can expect to gain 28-40 lb (about 13-18 kg), and if you are overweight, you can expect to gain 15-25 lb (about 7-11 kg). You may begin to get stretch marks on your hips, abdomen, and breasts. Your breasts will continue to grow and may hurt. A yellow fluid (colostrum) may leak from your breasts. This is the first milk you are producing for your baby. You may have changes in your hair. These can include thickening of your hair, rapid growth, and changes in texture. Some people also have hair loss during or after pregnancy, or hair that feels dry or thin. Your belly button may stick out. You may notice more swelling in your hands, face, or ankles. Health changes You may have heartburn. You may have constipation. You may develop hemorrhoids. You may develop swollen, bulging veins (varicose veins) in your legs. You may have increased body aches in the pelvis, back, or thighs. This is due to weight gain and increased hormones that are relaxing your joints. You may have increased tingling or numbness in your hands, arms, and legs. The skin on your abdomen may also feel numb. You may feel short of breath because of your expanding  uterus. Other changes You may urinate more often because the fetus is moving lower into your pelvis and pressing on your bladder. You may have more problems sleeping. This may be caused by the size of your abdomen, an increased need to urinate, and an increase in your body's metabolism. You may notice the fetus "dropping," or moving lower in your abdomen (lightening). You may have increased vaginal discharge. You may notice that you have pain around your pelvic bone as your uterus distends. Follow these instructions at home: Medicines Follow your health care provider's instructions regarding medicine use. Specific medicines may be either safe or unsafe to take during pregnancy. Do not take any medicines unless approved by your health care provider. Take a prenatal vitamin that contains at least 600 micrograms (mcg) of folic acid. Eating and drinking Eat a healthy diet that includes fresh fruits and vegetables, whole grains, good sources of protein such as meat, eggs, or tofu, and low-fat dairy products. Avoid raw meat and unpasteurized juice, milk, and cheese. These carry germs that can harm you and your baby. Eat 4 or 5 small meals rather than 3 large meals a day. You may need to take these actions to prevent or treat constipation: Drink enough fluid to keep your urine pale yellow. Eat foods that are high in fiber, such as beans, whole grains, and fresh fruits and vegetables. Limit foods that are high in fat and processed sugars, such as fried or sweet foods. Activity Exercise only as directed by your health care provider. Most people can continue their usual exercise routine  during pregnancy. Try to exercise for 30 minutes at least 5 days a week. Stop exercising if you experience contractions in the uterus. Stop exercising if you develop pain or cramping in the lower abdomen or lower back. Avoid heavy lifting. Do not exercise if it is very hot or humid or if you are at a high altitude. If  you choose to, you may continue to have sex unless your health care provider tells you not to. Relieving pain and discomfort Take frequent breaks and rest with your legs raised (elevated) if you have leg cramps or low back pain. Take warm sitz baths to soothe any pain or discomfort caused by hemorrhoids. Use hemorrhoid cream if your health care provider approves. Wear a supportive bra to prevent discomfort from breast tenderness. If you develop varicose veins: Wear support hose as told by your health care provider. Elevate your feet for 15 minutes, 3-4 times a day. Limit salt in your diet. Safety Talk to your health care provider before traveling far distances. Do not use hot tubs, steam rooms, or saunas. Wear your seat belt at all times when driving or riding in a car. Talk with your health care provider if someone is verbally or physically abusive to you. Preparing for birth To prepare for the arrival of your baby: Take prenatal classes to understand, practice, and ask questions about labor and delivery. Visit the hospital and tour the maternity area. Purchase a rear-facing car seat and make sure you know how to install it in your car. Prepare the baby's room or sleeping area. Make sure to remove all pillows and stuffed animals from the baby's crib to prevent suffocation. General instructions Avoid cat litter boxes and soil used by cats. These carry germs that can cause birth defects in the baby. If you have a cat, ask someone to clean the litter box for you. Do not douche or use tampons. Do not use scented sanitary pads. Do not use any products that contain nicotine or tobacco, such as cigarettes, e-cigarettes, and chewing tobacco. If you need help quitting, ask your health care provider. Do not use any herbal remedies, illegal drugs, or medicines that were not prescribed to you. Chemicals in these products can harm your baby. Do not drink alcohol. You will have more frequent prenatal  exams during the third trimester. During a routine prenatal visit, your health care provider will do a physical exam, perform tests, and discuss your overall health. Keep all follow-up visits. This is important. Where to find more information American Pregnancy Association: americanpregnancy.org [employer]ians [employer]necologists: https://www.todd-brady.net/ Office on [employer] Health: MightyReward.co.nz Contact a health care provider if you have: A fever. Mild pelvic cramps, pelvic pressure, or nagging pain in your abdominal area or lower back. Vomiting or diarrhea. Bad-smelling vaginal discharge or foul-smelling urine. Pain when you urinate. A headache that does not go away when you take medicine. Visual changes or see spots in front of your eyes. Get help right away if: Your water breaks. You have regular contractions less than 5 minutes apart. You have spotting or bleeding from your vagina. You have severe abdominal pain. You have difficulty breathing. You have chest pain. You have fainting spells. You have not felt your baby move for the time period told by your health care provider. You have new or increased pain, swelling, or redness in an arm or leg. Summary The third trimester of pregnancy is from week 28 through week 40 (months 7 through 9). You may  have more problems sleeping. This can be caused by the size of your abdomen, an increased need to urinate, and an increase in your body's metabolism. You will have more frequent prenatal exams during the third trimester. Keep all follow-up visits. This is important. This information is not intended to replace advice given to you by your health care provider. Make sure you discuss any questions you have with your healthcare provider. Document Revised: 07/08/2019 Document Reviewed: 05/14/2019 Elsevier Patient Education  2022 [employer].

## 2020-08-03 LAB — 28 WEEK RH+PANEL
Basophils Absolute: 0 10*3/uL (ref 0.0–0.2)
Basos: 0 %
EOS (ABSOLUTE): 0.1 10*3/uL (ref 0.0–0.4)
Eos: 1 %
Gestational Diabetes Screen: 64 mg/dL — ABNORMAL LOW (ref 65–139)
HIV Screen 4th Generation wRfx: NONREACTIVE
Hematocrit: 33.5 % — ABNORMAL LOW (ref 34.0–46.6)
Hemoglobin: 11.7 g/dL (ref 11.1–15.9)
Immature Grans (Abs): 0.2 10*3/uL — ABNORMAL HIGH (ref 0.0–0.1)
Immature Granulocytes: 2 %
Lymphocytes Absolute: 1.7 10*3/uL (ref 0.7–3.1)
Lymphs: 17 %
MCH: 30.5 pg (ref 26.6–33.0)
MCHC: 34.9 g/dL (ref 31.5–35.7)
MCV: 87 fL (ref 79–97)
Monocytes Absolute: 0.5 10*3/uL (ref 0.1–0.9)
Monocytes: 6 %
Neutrophils Absolute: 7.3 10*3/uL — ABNORMAL HIGH (ref 1.4–7.0)
Neutrophils: 74 %
Platelets: 165 10*3/uL (ref 150–450)
RBC: 3.84 x10E6/uL (ref 3.77–5.28)
RDW: 13.1 % (ref 11.7–15.4)
RPR Ser Ql: NONREACTIVE
WBC: 9.9 10*3/uL (ref 3.4–10.8)

## 2020-08-08 ENCOUNTER — Telehealth: Payer: Self-pay

## 2020-08-08 NOTE — Telephone Encounter (Signed)
Let her know- no worrisome symptoms, but if very uncomfortable one of [place] can see her and assess.

## 2020-08-08 NOTE — Telephone Encounter (Signed)
Pt aware; states it is about the same; if worsens she will let us know.

## 2020-08-08 NOTE — Telephone Encounter (Signed)
Please advise 

## 2020-08-08 NOTE — Telephone Encounter (Signed)
Pt calling; called after hour nurse over weekend; having pain on both sides; feels like there is a band across her belly; this is day four of having it; is super constant; what was rec over weekend didn't help; seems worse after eating.  What to do?  [phone]  Pt states it is not ctxs; gets worse and better at random times without her doing anything; has GFM which is reassuring to her; no unusual d/c; no vaginal bleeding.  Adv will need to get back with her.

## 2020-08-19 ENCOUNTER — Encounter: Payer: Self-pay | Admitting: Obstetrics and Gynecology

## 2020-08-19 ENCOUNTER — Ambulatory Visit (INDEPENDENT_AMBULATORY_CARE_PROVIDER_SITE_OTHER): Payer: BC Managed Care – PPO | Admitting: Obstetrics and Gynecology

## 2020-08-19 ENCOUNTER — Other Ambulatory Visit: Payer: Self-pay

## 2020-08-19 VITALS — BP 100/60 | Wt 156.0 lb

## 2020-08-19 DIAGNOSIS — Z3483 Encounter for supervision of other normal pregnancy, third trimester: Secondary | ICD-10-CM

## 2020-08-19 DIAGNOSIS — O09293 Supervision of pregnancy with other poor reproductive or obstetric history, third trimester: Secondary | ICD-10-CM

## 2020-08-19 DIAGNOSIS — Z3A31 31 weeks gestation of pregnancy: Secondary | ICD-10-CM

## 2020-08-19 LAB — POCT URINALYSIS DIPSTICK OB
Glucose, UA: NEGATIVE
POC,PROTEIN,UA: NEGATIVE

## 2020-08-19 NOTE — Progress Notes (Signed)
  Routine Prenatal Care Visit  Subjective  Emily Mosley is a 25 y.o. G3P2002 at [redacted]w[redacted]d being seen today for ongoing prenatal care.  She is currently monitored for the following issues for this low-risk pregnancy and has History of shoulder dystocia in prior pregnancy, currently pregnant in third trimester and Supervision of other normal pregnancy, antepartum on their problem list.  ----------------------------------------------------------------------------------- Patient reports no complaints.   Contractions: Not present. Vag. Bleeding: None.  Movement: Present. Leaking Fluid denies.  ----------------------------------------------------------------------------------- The following portions of the patient's history were reviewed and updated as appropriate: allergies, current medications, past family history, past medical history, past social history, past surgical history and problem list. Problem list updated.  Objective  Blood pressure 100/60, weight 156 lb (70.8 kg), last menstrual period 01/12/2020. Pregravid weight 130 lb (59 kg) Total Weight Gain 26 lb (11.8 kg) Urinalysis: Urine Protein Negative  Urine Glucose Negative  Fetal Status: Fetal Heart Rate (bpm): 135 Fundal Height: 31 cm Movement: Present     General:  Alert, oriented and cooperative. Patient is in no acute distress.  Skin: Skin is warm and dry. No rash noted.   Cardiovascular: Normal heart rate noted  Respiratory: Normal respiratory effort, no problems with respiration noted  Abdomen: Soft, gravid, appropriate for gestational age. Pain/Pressure: Absent     Pelvic:  Cervical exam deferred        Extremities: Normal range of motion.  Edema: None  Mental Status: Normal mood and affect. Normal behavior. Normal judgment and thought content.   Assessment   25 y.o. [id number] at [redacted]w[redacted]d by  10/18/2020, by Last Menstrual Period presenting for routine prenatal visit  Plan   pregnancy2  Problems (from 01/24/20 to present)      Problem Noted Resolved   Supervision of other normal pregnancy, antepartum 07/04/2020 by Nadara Mustard, MD No   Overview Addendum 08/02/2020 11:28 AM by Natale Milch, MD    Clinic [facility] first, then [facility] Prenatal Labs  Dating US/LMP Blood type:   B+  Genetic Screen Declined Antibody: Neg  Anatomic [place] [facility], nml Rubella:   Imm Varicella: Imm  GTT   Third trimester:  RPR:   NR  Rhogam n/a HBsAg:   NR  TDaP vaccine                       Flu Shot: HIV:   Nrg  Baby Food Breast GBS:   Contraception POP vs Nexplanon Pap:due PP  CBB  no   CS/VBAC n/a   Support Person Husband     History of shoulder dystocia            Preterm labor symptoms and general obstetric precautions including but not limited to vaginal bleeding, contractions, leaking of fluid and fetal movement were reviewed in detail with the patient. Please refer to After Visit Summary for other counseling recommendations.   Return in about 2 weeks (around 09/02/2020) for Routine Prenatal Appointment.   Thomasene Mohair, MD, [facility], [facility] 08/19/2020 11:41 AM

## 2020-09-13 ENCOUNTER — Ambulatory Visit (INDEPENDENT_AMBULATORY_CARE_PROVIDER_SITE_OTHER): Payer: BC Managed Care – PPO | Admitting: Obstetrics and Gynecology

## 2020-09-13 ENCOUNTER — Other Ambulatory Visit: Payer: Self-pay

## 2020-09-13 ENCOUNTER — Encounter: Payer: Self-pay | Admitting: Obstetrics and Gynecology

## 2020-09-13 VITALS — BP 112/70 | Ht 64.0 in | Wt 161.8 lb

## 2020-09-13 DIAGNOSIS — Z23 Encounter for immunization: Secondary | ICD-10-CM

## 2020-09-13 DIAGNOSIS — Z3A35 35 weeks gestation of pregnancy: Secondary | ICD-10-CM

## 2020-09-13 DIAGNOSIS — Z3480 Encounter for supervision of other normal pregnancy, unspecified trimester: Secondary | ICD-10-CM

## 2020-09-13 DIAGNOSIS — O26843 Uterine size-date discrepancy, third trimester: Secondary | ICD-10-CM

## 2020-09-13 LAB — POCT URINALYSIS DIPSTICK OB
Glucose, UA: NEGATIVE
POC,PROTEIN,UA: NEGATIVE

## 2020-09-13 NOTE — Patient Instructions (Signed)

## 2020-09-13 NOTE — Addendum Note (Signed)
Addended by: Clement Husbands A on: 09/13/2020 04:42 PM   Modules accepted: Orders

## 2020-09-13 NOTE — Progress Notes (Signed)
    Routine Prenatal Care Visit  Subjective  Emily Mosley is a 25 y.o. G3P2002 at [redacted]w[redacted]d being seen today for ongoing prenatal care.  She is currently monitored for the following issues for this low-risk pregnancy and has History of shoulder dystocia in prior pregnancy, currently pregnant in third trimester and Supervision of other normal pregnancy, antepartum on their problem list.  ----------------------------------------------------------------------------------- Patient reports no complaints.   Contractions: Irregular. Vag. Bleeding: None.  Movement: Present. Denies leaking of fluid.  ----------------------------------------------------------------------------------- The following portions of the patient's history were reviewed and updated as appropriate: allergies, current medications, past family history, past medical history, past social history, past surgical history and problem list. Problem list updated.   Objective  Blood pressure 112/70, height 5\' 4"  (1.626 m), weight 161 lb 12.8 oz (73.4 kg), last menstrual period 01/12/2020. Pregravid weight 130 lb (59 kg) Total Weight Gain 31 lb 12.8 oz (14.4 kg) Urinalysis:      Fetal Status: Fetal Heart Rate (bpm): 120 Fundal Height: 31 cm Movement: Present     General:  Alert, oriented and cooperative. Patient is in no acute distress.  Skin: Skin is warm and dry. No rash noted.   Cardiovascular: Normal heart rate noted  Respiratory: Normal respiratory effort, no problems with respiration noted  Abdomen: Soft, gravid, appropriate for gestational age. Pain/Pressure: Absent     Pelvic:  Cervical exam deferred        Extremities: Normal range of motion.  Edema: None  Mental Status: Normal mood and affect. Normal behavior. Normal judgment and thought content.     Assessment   25 y.o. 24 at [redacted]w[redacted]d by  10/18/2020, by Last Menstrual Period presenting for routine prenatal visit  Plan   pregnancy2  Problems (from 01/24/20 to  present)     Problem Noted Resolved   Supervision of other normal pregnancy, antepartum 07/04/2020 by 07/06/2020, MD No   Overview Addendum 09/13/2020  4:37 PM by 11/13/2020, MD    Clinic [facility] first, then [facility] Prenatal Labs  Dating US/LMP Blood type:   B+  Genetic Screen Declined Antibody: Neg  Anatomic [facility], nml Rubella:   Imm Varicella: Imm  GTT   Third trimester: 64 RPR:   NR  Rhogam n/a HBsAg:   NR  TDaP vaccine  09/13/2020  Flu Shot: HIV:   Nrg  Baby Food Breast GBS:   Contraception POP vs Nexplanon Pap:due PP  CBB  no   CS/VBAC n/a   Support Person Husband     History of shoulder dystocia          TDAP today Growth 11/13/2020 ordered for size date discrepancy   Gestational age appropriate obstetric precautions including but not limited to vaginal bleeding, contractions, leaking of fluid and fetal movement were reviewed in detail with the patient.    Return in about 1 week (around 09/20/2020) for ROB in person.  11/20/2020 MD [facility], [facility] 09/13/2020, 4:40 PM

## 2020-09-14 ENCOUNTER — Ambulatory Visit
Admission: RE | Admit: 2020-09-14 | Discharge: 2020-09-14 | Disposition: A | Discharge status: Home / Self Care | Payer: BC Managed Care – PPO | Source: Ambulatory Visit | Attending: Obstetrics and Gynecology | Admitting: Obstetrics and Gynecology

## 2020-09-14 DIAGNOSIS — O26843 Uterine size-date discrepancy, third trimester: Secondary | ICD-10-CM | POA: Insufficient documentation

## 2020-09-21 ENCOUNTER — Encounter: Payer: Self-pay | Admitting: Advanced Practice Midwife

## 2020-09-21 ENCOUNTER — Other Ambulatory Visit: Payer: Self-pay

## 2020-09-21 ENCOUNTER — Ambulatory Visit (INDEPENDENT_AMBULATORY_CARE_PROVIDER_SITE_OTHER): Payer: BC Managed Care – PPO | Admitting: Advanced Practice Midwife

## 2020-09-21 ENCOUNTER — Other Ambulatory Visit (HOSPITAL_COMMUNITY)
Admission: RE | Admit: 2020-09-21 | Discharge: 2020-09-21 | Disposition: A | Discharge status: Home / Self Care | Payer: BC Managed Care – PPO | Source: Ambulatory Visit | Attending: Advanced Practice Midwife | Admitting: Advanced Practice Midwife

## 2020-09-21 VITALS — BP 114/72 | Wt 163.0 lb

## 2020-09-21 DIAGNOSIS — Z3685 Encounter for antenatal screening for Streptococcus B: Secondary | ICD-10-CM

## 2020-09-21 DIAGNOSIS — Z3A36 36 weeks gestation of pregnancy: Secondary | ICD-10-CM | POA: Diagnosis present

## 2020-09-21 DIAGNOSIS — Z3480 Encounter for supervision of other normal pregnancy, unspecified trimester: Secondary | ICD-10-CM | POA: Insufficient documentation

## 2020-09-21 DIAGNOSIS — Z113 Encounter for screening for infections with a predominantly sexual mode of transmission: Secondary | ICD-10-CM | POA: Insufficient documentation

## 2020-09-21 DIAGNOSIS — Z369 Encounter for antenatal screening, unspecified: Secondary | ICD-10-CM

## 2020-09-21 LAB — POCT URINALYSIS DIPSTICK OB
Glucose, UA: NEGATIVE
POC,PROTEIN,UA: NEGATIVE

## 2020-09-21 NOTE — Progress Notes (Signed)
  Routine Prenatal Care Visit  Subjective  Emily Mosley is a 25 y.o. G3P2002 at [redacted]w[redacted]d being seen today for ongoing prenatal care.  She is currently monitored for the following issues for this low-risk pregnancy and has History of shoulder dystocia in prior pregnancy, currently pregnant in third trimester; Supervision of other normal pregnancy, antepartum; Stress incontinence; and Anxiety on their problem list.  ----------------------------------------------------------------------------------- Patient reports no complaints.  She desires 39 week IOL due to history of shoulder dystocia with G1. Contractions: Irregular. Vag. Bleeding: None.  Movement: Present. Leaking Fluid denies.  ----------------------------------------------------------------------------------- The following portions of the patient's history were reviewed and updated as appropriate: allergies, current medications, past family history, past medical history, past social history, past surgical history and problem list. Problem list updated.  Objective  Blood pressure 114/72, weight 163 lb (73.9 kg), last menstrual period 01/12/2020. Pregravid weight 130 lb (59 kg) Total Weight Gain 33 lb (15 kg) Urinalysis: Urine Protein Negative  Urine Glucose Negative  Fetal Status: Fetal Heart Rate (bpm): 134 Fundal Height: 36 cm Movement: Present     General:  Alert, oriented and cooperative. Patient is in no acute distress.  Skin: Skin is warm and dry. No rash noted.   Cardiovascular: Normal heart rate noted  Respiratory: Normal respiratory effort, no problems with respiration noted  Abdomen: Soft, gravid, appropriate for gestational age. Pain/Pressure: Absent     Pelvic:  Cervical exam deferred      GBS/aptima collected  Extremities: Normal range of motion.  Edema: None  Mental Status: Normal mood and affect. Normal behavior. Normal judgment and thought content.   Assessment   25 y.o. [id number] at [redacted]w[redacted]d by  10/18/2020, by Last  Menstrual Period presenting for routine prenatal visit  Plan   pregnancy2  Problems (from 01/24/20 to present)    Problem Noted Resolved   Supervision of other normal pregnancy, antepartum 07/04/2020 by Nadara Mustard, MD No   Overview Addendum 09/13/2020  4:37 PM by Natale Milch, MD    Clinic [facility] first, then [facility] Prenatal Labs  Dating US/LMP Blood type:   B+  Genetic Screen Declined Antibody: Neg  Anatomic [place] [facility], nml Rubella:   Imm Varicella: Imm  GTT   Third trimester: 64 RPR:   NR  Rhogam n/a HBsAg:   NR  TDaP vaccine  09/13/2020  Flu Shot: HIV:   Nrg  Baby Food Breast GBS:   Contraception POP vs Nexplanon Pap:due PP  CBB  no   CS/VBAC n/a   Support Person Husband     History of shoulder dystocia          Preterm labor symptoms and general obstetric precautions including but not limited to vaginal bleeding, contractions, leaking of fluid and fetal movement were reviewed in detail with the patient. Please refer to After Visit Summary for other counseling recommendations.   Return in about 1 week (around 09/28/2020) for weekly ROB x3.  Tresea Mall, CNM 09/21/2020 11:56 AM

## 2020-09-21 NOTE — Progress Notes (Signed)
ROB - GBS, no concerns. RM 3

## 2020-09-22 LAB — CERVICOVAGINAL ANCILLARY ONLY
Chlamydia: NEGATIVE
Comment: NEGATIVE
Comment: NEGATIVE
Comment: NORMAL
Neisseria Gonorrhea: NEGATIVE
Trichomonas: NEGATIVE

## 2020-09-23 LAB — STREP GP B NAA: Strep Gp B NAA: NEGATIVE

## 2020-09-27 ENCOUNTER — Other Ambulatory Visit: Payer: Self-pay | Admitting: Obstetrics & Gynecology

## 2020-09-28 ENCOUNTER — Ambulatory Visit (INDEPENDENT_AMBULATORY_CARE_PROVIDER_SITE_OTHER): Payer: BC Managed Care – PPO | Admitting: Obstetrics and Gynecology

## 2020-09-28 ENCOUNTER — Other Ambulatory Visit: Payer: Self-pay

## 2020-09-28 VITALS — BP 110/70 | Wt 165.0 lb

## 2020-09-28 DIAGNOSIS — Z3480 Encounter for supervision of other normal pregnancy, unspecified trimester: Secondary | ICD-10-CM

## 2020-09-28 DIAGNOSIS — Z3A37 37 weeks gestation of pregnancy: Secondary | ICD-10-CM

## 2020-09-28 LAB — POCT URINALYSIS DIPSTICK OB
Glucose, UA: NEGATIVE
POC,PROTEIN,UA: NEGATIVE

## 2020-09-28 NOTE — Progress Notes (Signed)
ROB - no concerns. RM 2 

## 2020-09-28 NOTE — Progress Notes (Signed)
  [facility] REGIONAL BIRTHPLACE INDUCTION ASSESSMENT SCHEDULING Emily Mosley 02-11-1996 Medical record #: [id number] Phone #:  Home Phone [phone]  Mobile [phone]    [facility]atal [facility] Proposed admission date/time:10/12/20 8AM Method of induction:Cytotec  Weight: Filed Weights08/17/22 1150Weight:165 lb (74.8 kg) BMI Body mass index is 28.32 kg/m. HIV Negative HSV Negative EDC Estimated Date of Delivery: 9/6/22based on:LMP  Gestational age on admission: [redacted]w[redacted]d Gravidity/parity:G3P2002  Cervix Score   0 1 2 3   Position Posterior Midposition Anterior   Consistency Firm Medium Soft   Effacement (%) 0-30 40-50 60-70 >80  Dilation (cm) Closed 1-2 3-4 >5  Baby's station -3 -2 -1 +1, +2   Bishop Score:4   Medical induction of labor  select indication(s) below Elective induction ?39 weeks multiparous patient ?39 weeks primiparous patient with Bishop score ?7 ?40 weeks primiparous patient   Medical Indications Adapted from ACOG Committee Opinion #560, "Medically Indicated Late Preterm and Early Term Deliveries," 2013.  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 (41 weeks) Mo-Di isolated (32.0-34.6) Pregestational, vascular compromise (37.0- 39.0)  PPROM  (34.0) Multiple Gestation Gestational, diet controlled (40.0)  Hx of IUFD (39.0 weeks) Di-Di (38.0-38.6) Gestational, med controlled (39.0)  Polyhydramnios, mild/moderate; SDV 8-16 or AFI 25-35 (39.0) Mo-Di (36.0-37.6) Gestational, uncontrolled (38.0-39.0)  Oligohydramnios (36.0-37.6); MVP <2 cm  For indications not listed above, delivery recommendations from maternal-fetal medicine consultant occurred on: Date: with Dr.   Provider Signature: Audi Wettstein Scheduled by: Date:09/28/2020 1:04 PM   Call [phone] to finalize the induction date/time  [phone] (07/17)

## 2020-09-28 NOTE — Progress Notes (Signed)
2

## 2020-09-28 NOTE — Progress Notes (Signed)
    Routine Prenatal Care Visit  Subjective  Emily Mosley is a 25 y.o. G3P2002 at [redacted]w[redacted]d being seen today for ongoing prenatal care.  She is currently monitored for the following issues for this low-risk pregnancy and has History of shoulder dystocia in prior pregnancy, currently pregnant in third trimester; Supervision of other normal pregnancy, antepartum; Stress incontinence; and Anxiety on their problem list.  ----------------------------------------------------------------------------------- Patient reports no complaints.   Contractions: Irregular. Vag. Bleeding: None.  Movement: Present. Denies leaking of fluid.  ----------------------------------------------------------------------------------- The following portions of the patient's history were reviewed and updated as appropriate: allergies, current medications, past family history, past medical history, past social history, past surgical history and problem list. Problem list updated.   Objective  Last menstrual period 01/12/2020. Pregravid weight 130 lb (59 kg) Total Weight Gain 35 lb (15.9 kg) Urinalysis:      Fetal Status: Fetal Heart Rate (bpm): 130 Fundal Height: 36 cm Movement: Present  Presentation: Vertex  General:  Alert, oriented and cooperative. Patient is in no acute distress.  Skin: Skin is warm and dry. No rash noted.   Cardiovascular: Normal heart rate noted  Respiratory: Normal respiratory effort, no problems with respiration noted  Abdomen: Soft, gravid, appropriate for gestational age. Pain/Pressure: Absent     Pelvic:  Cervical exam deferred        Extremities: Normal range of motion.  Edema: None  ental Status: Normal mood and affect. Normal behavior. Normal judgment and thought content.     Assessment   25 y.o. [id number] at [redacted]w[redacted]d by  10/18/2020, by Last Menstrual Period presenting for routine prenatal visit  Plan   pregnancy2  Problems (from 01/24/20 to present)     Problem Noted Resolved    Supervision of other normal pregnancy, antepartum 07/04/2020 by Nadara Mustard, MD No   Overview Addendum 09/13/2020  4:37 PM by Natale Milch, MD    Clinic [facility] first, then [facility] Prenatal Labs  Dating US/LMP Blood type:   B+  Genetic Screen Declined Antibody: Neg  Anatomic [place] [facility], nml Rubella:   Imm Varicella: Imm  GTT   Third trimester: 64 RPR:   NR  Rhogam n/a HBsAg:   NR  TDaP vaccine  09/13/2020  Flu Shot: HIV:   Nrg  Baby Food Breast GBS:   Contraception POP vs Nexplanon Pap:due PP  CBB  no   CS/VBAC n/a   Support Person Husband     History of shoulder dystocia G1 9lbs 3oz, no issues G2 7lbs 1oz           Gestational age appropriate obstetric precautions including but not limited to vaginal bleeding, contractions, leaking of fluid and fetal movement were reviewed in detail with the patient.    - schedule IOL at 38 week visit 09/11/20 8 AM  - orders placed  Return in about 1 week (around 10/05/2020) for ROB.  Vena Austria, MD, Evern Core [facility], [facility] 09/28/2020, 11:45 AM

## 2020-10-06 ENCOUNTER — Encounter: Payer: Self-pay | Admitting: Obstetrics and Gynecology

## 2020-10-06 ENCOUNTER — Other Ambulatory Visit: Payer: Self-pay

## 2020-10-06 ENCOUNTER — Ambulatory Visit (INDEPENDENT_AMBULATORY_CARE_PROVIDER_SITE_OTHER): Payer: BC Managed Care – PPO | Admitting: Obstetrics and Gynecology

## 2020-10-06 VITALS — BP 122/75 | Wt 165.0 lb

## 2020-10-06 DIAGNOSIS — O09293 Supervision of pregnancy with other poor reproductive or obstetric history, third trimester: Secondary | ICD-10-CM

## 2020-10-06 DIAGNOSIS — Z3483 Encounter for supervision of other normal pregnancy, third trimester: Secondary | ICD-10-CM

## 2020-10-06 DIAGNOSIS — Z3A38 38 weeks gestation of pregnancy: Secondary | ICD-10-CM

## 2020-10-06 NOTE — Progress Notes (Signed)
  Routine Prenatal Care Visit  Subjective  Emily Mosley is a 25 y.o. G3P2002 at [redacted]w[redacted]d being seen today for ongoing prenatal care.  She is currently monitored for the following issues for this low-risk pregnancy and has History of shoulder dystocia in prior pregnancy, currently pregnant in third trimester; Supervision of other normal pregnancy, antepartum; Stress incontinence; and Anxiety on their problem list.  ----------------------------------------------------------------------------------- Patient reports no complaints.   Contractions: Irregular. Vag. Bleeding: None.  Movement: Present. Leaking Fluid denies.  ----------------------------------------------------------------------------------- The following portions of the patient's history were reviewed and updated as appropriate: allergies, current medications, past family history, past medical history, past social history, past surgical history and problem list. Problem list updated.  Objective  Blood pressure 122/75, weight 165 lb (74.8 kg), last menstrual period 01/12/2020. Pregravid weight 130 lb (59 kg) Total Weight Gain 35 lb (15.9 kg) Urinalysis: Urine Protein    Urine Glucose    Fetal Status: Fetal Heart Rate (bpm): 140 Fundal Height: 37 cm Movement: Present  Presentation: Vertex  General:  Alert, oriented and cooperative. Patient is in no acute distress.  Skin: Skin is warm and dry. No rash noted.   Cardiovascular: Normal heart rate noted  Respiratory: Normal respiratory effort, no problems with respiration noted  Abdomen: Soft, gravid, appropriate for gestational age. Pain/Pressure: Present     Pelvic:  Cervical exam deferred        Extremities: Normal range of motion.  Edema: None  Mental Status: Normal mood and affect. Normal behavior. Normal judgment and thought content.   Assessment   25 y.o. [id number] at [redacted]w[redacted]d by  10/18/2020, by Last Menstrual Period presenting for routine prenatal visit  Plan   pregnancy2   Problems (from 01/24/20 to present)     Problem Noted Resolved   Supervision of other normal pregnancy, antepartum 07/04/2020 by Nadara Mustard, MD No   Overview Addendum 09/28/2020 11:58 AM by Vena Austria, MD    Clinic [facility] first, then [facility] Prenatal Labs  Dating US/LMP Blood type:   B+  Genetic Screen Declined Antibody: Neg  Anatomic [place] [facility], nml Rubella:   Imm Varicella: Imm  GTT   Third trimester: 65 RPR:   NR  Rhogam n/a HBsAg:   NR  TDaP vaccine  09/13/2020  Flu Shot: HIV:   Nrg  Baby Food Breast GBS: negative  Contraception POP vs Nexplanon Pap:due PP  CBB  no   CS/VBAC n/a   Support Person Husband     History of shoulder dystocia  G1 9lbs 3oz, no issues G2 7lbs 1oz           Term labor symptoms and general obstetric precautions including but not limited to vaginal bleeding, contractions, leaking of fluid and fetal movement were reviewed in detail with the patient. Please refer to After Visit Summary for other counseling recommendations.   IOL scheduled for 8/31 at 0800. Patient knows to get COVID 19 screen on 8/29.  Return in 6 days (on 10/12/2020) for ROB (already scheduled).   Thomasene Mohair, MD, [facility], [facility] 10/06/2020 9:36 AM

## 2020-10-10 ENCOUNTER — Other Ambulatory Visit
Admission: RE | Admit: 2020-10-10 | Discharge: 2020-10-10 | Disposition: A | Discharge status: Home / Self Care | Payer: BC Managed Care – PPO | Source: Ambulatory Visit | Attending: Obstetrics and Gynecology | Admitting: Obstetrics and Gynecology

## 2020-10-10 ENCOUNTER — Other Ambulatory Visit: Payer: Self-pay

## 2020-10-10 DIAGNOSIS — Z01812 Encounter for preprocedural laboratory examination: Secondary | ICD-10-CM | POA: Insufficient documentation

## 2020-10-10 DIAGNOSIS — Z20822 Contact with and (suspected) exposure to covid-19: Secondary | ICD-10-CM | POA: Insufficient documentation

## 2020-10-10 LAB — SARS CORONAVIRUS 2 (TAT 6-24 HRS): SARS Coronavirus 2: NEGATIVE

## 2020-10-12 ENCOUNTER — Inpatient Hospital Stay: Payer: BC Managed Care – PPO | Admitting: Anesthesiology

## 2020-10-12 ENCOUNTER — Encounter: Payer: Self-pay | Admitting: Obstetrics and Gynecology

## 2020-10-12 ENCOUNTER — Other Ambulatory Visit: Payer: Self-pay

## 2020-10-12 ENCOUNTER — Encounter: Payer: BC Managed Care – PPO | Admitting: Advanced Practice Midwife

## 2020-10-12 ENCOUNTER — Inpatient Hospital Stay
Admission: RE | Admit: 2020-10-12 | Discharge: 2020-10-14 | DRG: 807 | Disposition: A | Discharge status: Home / Self Care | Payer: BC Managed Care – PPO | Attending: Obstetrics and Gynecology | Admitting: Obstetrics and Gynecology

## 2020-10-12 DIAGNOSIS — Z3490 Encounter for supervision of normal pregnancy, unspecified, unspecified trimester: Secondary | ICD-10-CM | POA: Diagnosis present

## 2020-10-12 DIAGNOSIS — Z3A39 39 weeks gestation of pregnancy: Secondary | ICD-10-CM

## 2020-10-12 DIAGNOSIS — Z20822 Contact with and (suspected) exposure to covid-19: Secondary | ICD-10-CM | POA: Diagnosis present

## 2020-10-12 DIAGNOSIS — O26893 Other specified pregnancy related conditions, third trimester: Secondary | ICD-10-CM | POA: Diagnosis present

## 2020-10-12 DIAGNOSIS — Z3480 Encounter for supervision of other normal pregnancy, unspecified trimester: Secondary | ICD-10-CM

## 2020-10-12 LAB — CBC
HCT: 37.5 % (ref 36.0–46.0)
Hemoglobin: 13.3 g/dL (ref 12.0–15.0)
MCH: 31 pg (ref 26.0–34.0)
MCHC: 35.5 g/dL (ref 30.0–36.0)
MCV: 87.4 fL (ref 80.0–100.0)
Platelets: 143 10*3/uL — ABNORMAL LOW (ref 150–400)
RBC: 4.29 MIL/uL (ref 3.87–5.11)
RDW: 12.7 % (ref 11.5–15.5)
WBC: 8.4 10*3/uL (ref 4.0–10.5)
nRBC: 0 % (ref 0.0–0.2)

## 2020-10-12 MED ORDER — BUPIVACAINE HCL (PF) 0.25 % IJ SOLN
INTRAMUSCULAR | Status: DC | PRN
  Administered 2020-10-12 (×2): 5 mL via EPIDURAL

## 2020-10-12 MED ORDER — LIDOCAINE HCL (PF) 1 % IJ SOLN
INTRAMUSCULAR | Status: DC | PRN
  Administered 2020-10-12: 3 mL

## 2020-10-12 MED ORDER — SIMETHICONE 80 MG PO CHEW: 80.0000 mg | CHEWABLE_TABLET | ORAL | Status: DC | PRN

## 2020-10-12 MED ORDER — LACTATED RINGERS IV SOLN
500.0000 mL | INTRAVENOUS | Status: DC | PRN
  Administered 2020-10-12: 500 mL via INTRAVENOUS

## 2020-10-12 MED ORDER — FERROUS SULFATE 325 (65 FE) MG PO TABS
325.0000 mg | ORAL_TABLET | Freq: Two times a day (BID) | ORAL | Status: DC
  Administered 2020-10-13 – 2020-10-14 (×3): 325 mg via ORAL
  Filled 2020-10-12 (×3): qty 1

## 2020-10-12 MED ORDER — LIDOCAINE-EPINEPHRINE (PF) 1.5 %-1:200000 IJ SOLN
INTRAMUSCULAR | Status: DC | PRN
Start: 2020-10-12 — End: 2020-10-12
  Administered 2020-10-12: 3 mL via PERINEURAL

## 2020-10-12 MED ORDER — BUTORPHANOL TARTRATE 1 MG/ML IJ SOLN: 1.0000 mg | INTRAMUSCULAR | Status: DC | PRN

## 2020-10-12 MED ORDER — OXYTOCIN 10 UNIT/ML IJ SOLN
INTRAMUSCULAR | Status: AC
  Filled 2020-10-12: qty 2

## 2020-10-12 MED ORDER — DIPHENHYDRAMINE HCL 25 MG PO CAPS: 25.0000 mg | ORAL_CAPSULE | Freq: Four times a day (QID) | ORAL | Status: DC | PRN

## 2020-10-12 MED ORDER — DIBUCAINE (PERIANAL) 1 % EX OINT: 1.0000 "application " | TOPICAL_OINTMENT | CUTANEOUS | Status: DC | PRN

## 2020-10-12 MED ORDER — ACETAMINOPHEN 325 MG PO TABS
650.0000 mg | ORAL_TABLET | ORAL | Status: DC | PRN
  Filled 2020-10-12: qty 2

## 2020-10-12 MED ORDER — IBUPROFEN 600 MG PO TABS
600.0000 mg | ORAL_TABLET | Freq: Four times a day (QID) | ORAL | Status: DC
  Administered 2020-10-12 – 2020-10-14 (×6): 600 mg via ORAL
  Filled 2020-10-12 (×6): qty 1

## 2020-10-12 MED ORDER — TERBUTALINE SULFATE 1 MG/ML IJ SOLN: 0.2500 mg | Freq: Once | INTRAMUSCULAR | Status: DC | PRN

## 2020-10-12 MED ORDER — LIDOCAINE HCL (PF) 1 % IJ SOLN
INTRAMUSCULAR | Status: AC
  Filled 2020-10-12: qty 30

## 2020-10-12 MED ORDER — ACETAMINOPHEN 325 MG PO TABS
650.0000 mg | ORAL_TABLET | ORAL | Status: DC | PRN
  Administered 2020-10-12 – 2020-10-14 (×5): 650 mg via ORAL
  Filled 2020-10-12 (×4): qty 2

## 2020-10-12 MED ORDER — COCONUT OIL OIL
1.0000 "application " | TOPICAL_OIL | Status: DC | PRN
  Filled 2020-10-12: qty 120

## 2020-10-12 MED ORDER — SENNOSIDES-DOCUSATE SODIUM 8.6-50 MG PO TABS
2.0000 | ORAL_TABLET | ORAL | Status: DC
  Administered 2020-10-12 – 2020-10-13 (×2): 2 via ORAL
  Filled 2020-10-12 (×2): qty 2

## 2020-10-12 MED ORDER — MISOPROSTOL 200 MCG PO TABS
ORAL_TABLET | ORAL | Status: AC
  Filled 2020-10-12: qty 4

## 2020-10-12 MED ORDER — OXYTOCIN-SODIUM CHLORIDE 30-0.9 UT/500ML-% IV SOLN
2.5000 [IU]/h | INTRAVENOUS | Status: DC
  Filled 2020-10-12: qty 1000

## 2020-10-12 MED ORDER — PRENATAL MULTIVITAMIN CH
1.0000 | ORAL_TABLET | Freq: Every day | ORAL | Status: DC
  Administered 2020-10-13 – 2020-10-14 (×2): 1 via ORAL
  Filled 2020-10-12 (×2): qty 1

## 2020-10-12 MED ORDER — ONDANSETRON HCL 4 MG/2ML IJ SOLN: 4.0000 mg | Freq: Four times a day (QID) | INTRAMUSCULAR | Status: DC | PRN

## 2020-10-12 MED ORDER — SOD CITRATE-CITRIC ACID 500-334 MG/5ML PO SOLN: 30.0000 mL | ORAL | Status: DC | PRN

## 2020-10-12 MED ORDER — LIDOCAINE HCL (PF) 1 % IJ SOLN
30.0000 mL | INTRAMUSCULAR | Status: DC | PRN
  Filled 2020-10-12: qty 30

## 2020-10-12 MED ORDER — AMMONIA AROMATIC IN INHA
RESPIRATORY_TRACT | Status: AC
  Filled 2020-10-12: qty 10

## 2020-10-12 MED ORDER — ONDANSETRON HCL 4 MG PO TABS: 4.0000 mg | ORAL_TABLET | ORAL | Status: DC | PRN

## 2020-10-12 MED ORDER — OXYTOCIN BOLUS FROM INFUSION
333.0000 mL | Freq: Once | INTRAVENOUS | Status: AC
Start: 2020-10-12 — End: 2020-10-12
  Administered 2020-10-12: 333 mL via INTRAVENOUS

## 2020-10-12 MED ORDER — BENZOCAINE-MENTHOL 20-0.5 % EX AERO: 1.0000 "application " | INHALATION_SPRAY | CUTANEOUS | Status: DC | PRN

## 2020-10-12 MED ORDER — HYDROCODONE-ACETAMINOPHEN 5-325 MG PO TABS: 1.0000 | ORAL_TABLET | Freq: Three times a day (TID) | ORAL | Status: DC | PRN

## 2020-10-12 MED ORDER — MISOPROSTOL 25 MCG QUARTER TABLET
25.0000 ug | ORAL_TABLET | ORAL | Status: DC | PRN
  Administered 2020-10-12 (×2): 25 ug via VAGINAL
  Filled 2020-10-12 (×4): qty 1

## 2020-10-12 MED ORDER — ONDANSETRON HCL 4 MG/2ML IJ SOLN: 4.0000 mg | INTRAMUSCULAR | Status: DC | PRN

## 2020-10-12 MED ORDER — WITCH HAZEL-GLYCERIN EX PADS: 1.0000 "application " | MEDICATED_PAD | CUTANEOUS | Status: DC | PRN

## 2020-10-12 MED ORDER — LACTATED RINGERS IV SOLN: INTRAVENOUS | Status: DC

## 2020-10-12 MED ORDER — FENTANYL-BUPIVACAINE-NACL 0.5-0.125-0.9 MG/250ML-% EP SOLN
EPIDURAL | Status: AC
  Filled 2020-10-12: qty 250

## 2020-10-12 NOTE — Progress Notes (Signed)
Emily Mosley is a 25 y.o. G3P2002 at [redacted]w[redacted]d by ultrasound admitted for induction of labor due to Elective at term. Ash her bishop score was 2 upon admission, she was given Cytotec 25 mcg vaginally, and has been contracting regularly for several hours.  Subjective: she describes her ucs as only mildly painful. Her baby continues to move well, and she denies any vaginal bleeding  or LOF.   Objective: BP 120/65   Pulse (!) 58   Temp 98 F (36.7 C) (Oral)   Resp 18   Ht 5\' 4"  (1.626 m)   Wt 74.8 kg   LMP 01/12/2020   BMI 28.32 kg/m  No intake/output data recorded. No intake/output data recorded.  FHT:  FHR: 140s bpm, variability: moderate,  accelerations:  Present,  decelerations:  Absent UC:   regular, every 2.5-4 minutes SVE:   Dilation: 3 Effacement (%): 40 Station: -3 Exam by:: [email or web address] CNM Bishop score is 6. The baby has descnded well, and her cervix is now anterior  Labs: Lab Results  Component Value Date   WBC 8.4 10/12/2020   HGB 13.3 10/12/2020   HCT 37.5 10/12/2020   MCV 87.4 10/12/2020   PLT 143 (L) 10/12/2020    Assessment / Plan: Induction of labor due to term with favorable cervix,  progressing well on pitocin  Labor:  Excellent response from one dose of Cytotec. We discussed the option of starting pitocin, or placing another cytotec and then aiming for AROM. She prefers to avoid pitocin, if it is possible.  Fetal Wellbeing:  Category I Pain Control:  Labor support without medications I/D:  n/a Anticipated MOD:  NSVD Will recheck in 4 hours or PRN.  10/14/2020 10/12/2020, 1:51 PM

## 2020-10-12 NOTE — Anesthesia Procedure Notes (Signed)
Epidural Patient location during procedure: OB Start time: 10/12/2020 6:04 PM End time: 10/12/2020 6:23 PM  Staffing Resident/CRNA: Junious Silk, CRNA Performed: resident/CRNA   Preanesthetic Checklist Completed: patient identified, IV checked, site marked, risks and benefits discussed, surgical consent, monitors and equipment checked, pre-op evaluation and timeout performed  Epidural Patient position: sitting Prep: Betadine Patient monitoring: heart rate, continuous pulse ox and blood pressure Approach: midline Location: L3-L4 Injection technique: LOR air  Needle:  Needle type: Tuohy  Needle gauge: 17 G Needle length: 9 cm and 9 Catheter type: closed end flexible Catheter size: 20 Guage Test dose: negative and 1.5% lidocaine with Epi 1:200 K  Assessment Sensory level: T10 Events: blood not aspirated, injection not painful, no injection resistance, no paresthesia and negative IV test  Additional Notes   Patient tolerated the insertion well without complications.Reason for block:procedure for pain

## 2020-10-12 NOTE — H&P (Signed)
Emily Mosley is a 25 y.o. female presenting for elective induction of labor. She  denies any regular contrractions or LOF or vaginal bleeding. Her baby has been moving well.. OB History     Gravida  3   Para  2   Term  2   Preterm      AB      Living  2      SAB      IAB      Ectopic      Multiple  0   Live Births  2          Past Medical History:  Diagnosis Date   Shoulder dystocia, delivered    Past Surgical History:  Procedure Laterality Date   APPENDECTOMY     EYE MUSCLE SURGERY     AGE 57   MIrena IUD  2020   WISDOM TOOTH EXTRACTION  2015   ALL FOUR   Family History: family history includes Healthy in her father and mother; Thyroid disease in her maternal grandmother. Social History:  reports that she has never smoked. She has never used smokeless tobacco. She reports that she does not drink alcohol and does not use drugs.     Maternal Diabetes: No Genetic Screening: Normal Maternal Ultrasounds/Referrals: Normal Fetal Ultrasounds or other Referrals:  None Maternal Substance Abuse:  No Significant Maternal Medications:  None Significant Maternal Lab Results:  Group B Strep negative Other Comments:  None  Review of Systems  Constitutional: Negative.   HENT: Negative.    Eyes: Negative.   Respiratory: Negative.    Cardiovascular: Negative.   Gastrointestinal: Negative.   Endocrine: Negative.   Genitourinary: Negative.   Musculoskeletal: Negative.   Allergic/Immunologic: Negative.   Neurological: Negative.   Hematological: Negative.   Psychiatric/Behavioral: Negative.    History Dilation: 1.5 Effacement (%): 30 Station: -3 Exam by:: Liana Crocker CNM Blood pressure 124/74, pulse 80, temperature 97.8 F (36.6 C), temperature source Oral, resp. rate 18, height 5\' 4"  (1.626 m), weight 74.8 kg, last menstrual period 01/12/2020. Maternal Exam:  Uterine Assessment: Contraction strength is mild.  Contraction frequency is irregular.  Abdomen:  Estimated fetal weight is 7.5 lbs.   Fetal presentation: vertex Introitus: Normal vulva. Normal vagina.  Pelvis: adequate for delivery.   Cervix: Cervix evaluated by digital exam.    Physical Exam Constitutional:      Appearance: Normal appearance.  HENT:     Head: Normocephalic and atraumatic.     Nose: Nose normal.  Cardiovascular:     Rate and Rhythm: Normal rate and regular rhythm.     Pulses: Normal pulses.     Heart sounds: Normal heart sounds.  Pulmonary:     Effort: Pulmonary effort is normal.     Breath sounds: Normal breath sounds.  Abdominal:     General: Bowel sounds are normal.     Palpations: Abdomen is soft.     Comments: Gravid uterus  Genitourinary:    General: Normal vulva.  Musculoskeletal:        General: Normal range of motion.     Cervical back: Normal range of motion and neck supple.  Skin:    General: Skin is warm and dry.  Neurological:     General: No focal deficit present.     Mental Status: She is alert and oriented to person, place, and time.  Psychiatric:        Mood and Affect: Mood normal.  Behavior: Behavior normal.    Prenatal labs: ABO, Rh:  B+ Antibody:  neg Rubella:  immune RPR: Non Reactive (06/21 1157)  HBsAg:   negative HIV: Non Reactive (06/21 1157)  GBS: Negative/-- (08/10 1152)   Assessment/Plan: IUP 39 weeks 1 day for elective induction of labor. Reactive, reassuring fetal heart tracing, Cat 1. Bishop score is 2. Will commence with vaginal Cytotec.Recheck in 4 hours.  Routine admission labs, IV fluid- LR. Anticipate pitocin infusion once bishop score reaches 6 or more.  Regular diet while on Cytotec.  Continuous EFM. Dr. Jean Rosenthal is aware of patient admission and POC.  Mirna Mires, CNM  10/12/2020 9:38 AM    Claris Che Liana Crocker 10/12/2020, 9:34 AM

## 2020-10-12 NOTE — Anesthesia Preprocedure Evaluation (Signed)
Anesthesia Evaluation  Patient identified by MRN, date of birth, ID band Patient awake    Reviewed: Allergy & Precautions, NPO status , Patient's Chart, lab work & pertinent test results  History of Anesthesia Complications (+) POST - OP SPINAL HEADACHE and history of anesthetic complications  Airway Mallampati: II  TM Distance: >3 FB     Dental no notable dental hx.    Pulmonary neg pulmonary ROS,    Pulmonary exam normal        Cardiovascular negative cardio ROS Normal cardiovascular exam     Neuro/Psych negative neurological ROS  negative psych ROS   GI/Hepatic negative GI ROS, Neg liver ROS,   Endo/Other  negative endocrine ROS  Renal/GU negative Renal ROS  negative genitourinary   Musculoskeletal negative musculoskeletal ROS (+)   Abdominal   Peds negative pediatric ROS (+)  Hematology negative hematology ROS (+)   Anesthesia Other Findings   Reproductive/Obstetrics (+) Pregnancy                             Anesthesia Physical Anesthesia Plan  ASA: 2  Anesthesia Plan: Epidural   Post-op Pain Management:    Induction:   PONV Risk Score and Plan:   Airway Management Planned:   Additional Equipment:   Intra-op Plan:   Post-operative Plan:   Informed Consent:   Plan Discussed with: Anesthesiologist and CRNA  Anesthesia Plan Comments:         Anesthesia Quick Evaluation

## 2020-10-12 NOTE — Discharge Summary (Signed)
Postpartum Discharge Summary    Patient Name: Emily Mosley DOB: [birth year] MRN: [id number]  Date of admission: 10/12/2020 Delivery date:10/12/2020  Delivering provider: Prentice Docker D  Date of discharge: 10/14/2020  Admitting diagnosis: Encounter for elective induction of labor [Z34.90] Intrauterine pregnancy: [redacted]w[redacted]d    Secondary diagnosis:  Principal Problem:   Supervision of other normal pregnancy, antepartum Active Problems:   Encounter for elective induction of labor   [redacted] weeks gestation of pregnancy   Postpartum care following vaginal delivery  Additional problems: none    Discharge diagnosis: Term Pregnancy Delivered                                              Post partum procedures: none Augmentation: Cytotec Complications: None  Hospital course: Induction of Labor With Vaginal Delivery   25y.o. yo [id number] admitted to the hospital 10/12/2020 for induction of labor.  Indication for induction: Elective.  Patient had an uncomplicated labor course as follows: Membrane Rupture Time/Date: 5:27 PM ,10/12/2020   Delivery Method:Vaginal, Spontaneous  Episiotomy: None  Lacerations:  None  Details of delivery can be found in separate delivery note.  Patient had a routine postpartum course. Patient is discharged home 10/14/20.  Newborn Data: Birth date:10/12/2020  Birth time:6:36 PM  Gender:Female  Living status:Living  Apgars:8 ,9  Weight:2840 g   Magnesium Sulfate received: No BMZ received: No Rhophylac:N/A MMR:No T-DaP:Given prenatally 09/13/2020 Flu: No Transfusion:No  Physical exam  Vitals:   10/13/20 0808 10/13/20 1128 10/13/20 2304 10/14/20 0811  BP: 114/75 114/73 106/66 114/77  Pulse: 60 71 (!) 56 (!) 58  Resp: '20 18 18 16  ' Temp: 98.8 F (37.1 C) 98 F (36.7 C) 97.8 F (36.6 C) 98.1 F (36.7 C)  TempSrc: Oral Oral Oral Oral  SpO2: 98%  99% 98%  Weight:      Height:       General: alert, cooperative, and no distress Lochia:  appropriate Uterine Fundus: firm Incision: Healing well with no significant drainage DVT Evaluation: No evidence of DVT seen on physical exam. Negative Homan's sign. Labs: Lab Results  Component Value Date   WBC 15.2 (H) 10/13/2020   HGB 12.2 10/13/2020   HCT 33.9 (L) 10/13/2020   MCV 86.0 10/13/2020   PLT 130 (L) 10/13/2020   CMP Latest Ref Rng & Units 10/03/2013  Glucose 65 - 99 mg/dL 96  BUN 9 - 21 mg/dL 6(L)  Creatinine 0.60 - 1.30 mg/dL 0.51(L)  Sodium 132 - 141 mmol/L 139  Potassium 3.3 - 4.7 mmol/L 3.7  Chloride 97 - 107 mmol/L 104  CO2 16 - 25 mmol/L 23  Calcium 9.0 - 10.7 mg/dL 9.1  Total Protein 6.4 - 8.6 g/dL 7.4  Total Bilirubin 0.2 - 1.0 mg/dL 0.3  Alkaline Phos Unit/L 44(L)  AST 0 - 26 Unit/L 26  ALT U/L 15   Edinburgh Score: Edinburgh Postnatal Depression Scale Screening Tool 10/13/2020  I have been able to laugh and see the funny side of things. 0  I have looked forward with enjoyment to things. 0  I have blamed myself unnecessarily when things went wrong. 0  I have been anxious or worried for no good reason. 0  I have felt scared or panicky for no good reason. 0  Things have been getting on top of me. 0  I  have been so unhappy that I have had difficulty sleeping. 0  I have felt sad or miserable. 0  I have been so unhappy that I have been crying. 0  The thought of harming myself has occurred to me. 0  Edinburgh Postnatal Depression Scale Total 0      After visit meds:  Allergies as of 10/14/2020   No Known Allergies      Medication List     TAKE these medications    ibuprofen 600 MG tablet Commonly known as: ADVIL Take 1 tablet (600 mg total) by mouth every 6 (six) hours.   Prenate Mini 18-0.6-0.4-350 MG Caps Take 1 capsule by mouth daily.         Discharge home in stable condition Infant Feeding: Breast Infant Disposition:home with mother Discharge instruction: per After Visit Summary and Postpartum booklet. Activity: Advance as  tolerated. Pelvic rest for 6 weeks.  Diet: routine diet Anticipated Birth Control: Unsure and considering either IUD or fertility Awareness /lactational amenorrhea Postpartum Appointment:6 weeks Additional Postpartum F/U:  NA Future Appointments:No future appointments. Follow up Visit:  Follow-up Information     Will Bonnet, MD. Schedule an appointment as soon as possible for a visit in 6 week(s).   Specialty: Obstetrics and Gynecology Why: Six weeks postpartum visit Contact information: [address] [place] [address] [phone]                 SIGNED: Imagene Riches, CNM  10/14/2020 9:50 AM

## 2020-10-13 LAB — CBC
HCT: 33.9 % — ABNORMAL LOW (ref 36.0–46.0)
Hemoglobin: 12.2 g/dL (ref 12.0–15.0)
MCH: 31 pg (ref 26.0–34.0)
MCHC: 36 g/dL (ref 30.0–36.0)
MCV: 86 fL (ref 80.0–100.0)
Platelets: 130 10*3/uL — ABNORMAL LOW (ref 150–400)
RBC: 3.94 MIL/uL (ref 3.87–5.11)
RDW: 12.3 % (ref 11.5–15.5)
WBC: 15.2 10*3/uL — ABNORMAL HIGH (ref 4.0–10.5)
nRBC: 0 % (ref 0.0–0.2)

## 2020-10-13 LAB — RPR: RPR Ser Ql: NONREACTIVE

## 2020-10-13 LAB — TYPE AND SCREEN
ABO/RH(D): B POS
Antibody Screen: NEGATIVE

## 2020-10-13 NOTE — Lactation Note (Signed)
This note was copied from a baby's chart. Lactation Consultation Note  Patient Name: Emily Mosley [id number] Date: 10/13/2020 Reason for consult: Initial assessment;Term Age:24 hours  Attempted visit at 0900: mom taking a shower. Initial lactation visit. Mom awake and alert, baby sleepy. Mom is P3 SVD 20 hrs ago. Mom has some BF experience- attempted feedings at breast that evolved into pumping only for following 4-6 months. Mom reports severe nipple pain/damage with other 2 babies, never evaluated for an oral restrictions.  Baby has feedings documented and voids and stools appropriate for HOL.  Mom notes some discomfort with feedings, a slight blister on L side. LC talks with mom about position/latch, flanged lips, deep latch through a good sandwich. Encouraged football or cross-cradle hold to help have head control.   Feeding was attempted; baby was sleepy and not interested, but we did practice positioning and support of head, how to rub nipple nose to chin w/ EBM on tip to encourage baby to open wide for latch.  Coconut oil offered, as well as instructions for use, along with EBM onto tip of nipple.  Maternal Data Has patient been taught Hand Expression?: Yes Does the patient have breastfeeding experience prior to this delivery?: Yes How long did the patient breastfeed?: 4-6 months  Feeding Mother's Current Feeding Choice: Breast Milk  LATCH Score Latch: Too sleepy or reluctant, no latch achieved, no sucking elicited.                  Lactation Tools Discussed/Used    Interventions Interventions: Breast feeding basics reviewed;Assisted with latch;Hand express;Adjust position;Support pillows;Education  Discharge    Consult Status Consult Status: Follow-up Date: 10/14/20 Follow-up type: In-patient    Danford Bad 10/13/2020, 3:14 PM

## 2020-10-13 NOTE — Progress Notes (Signed)
Admit Date: 10/12/2020 Today's Date: 10/13/2020  Post Partum Day 1  Subjective:  no complaints, up ad lib, voiding, and tolerating PO  Objective: Temp:  [97.8 F (36.6 C)-98.8 F (37.1 C)] 98.8 F (37.1 C) (09/01 0808) Pulse Rate:  [50-91] 60 (09/01 0808) Resp:  [16-20] 20 (09/01 0808) BP: (88-140)/(58-83) 114/75 (09/01 0808) SpO2:  [98 %-100 %] 98 % (09/01 0808) Weight:  [74.8 kg] 74.8 kg (08/31 0833)  Physical Exam:  General: alert, cooperative, and no distress Lochia: appropriate Uterine Fundus: firm Incision: none DVT Evaluation: No evidence of DVT seen on physical exam.  Recent Labs    10/12/20 0912 10/13/20 0302  HGB 13.3 12.2  HCT 37.5 33.9*    Assessment/Plan: Plan for discharge tomorrow, Breastfeeding, Contraception (planning POP or Nexplanon, still unsure), and Infant doing well PNV No s/sx PPD/blues   LOS: 1 day   Emily Mosley [facility] 10/13/2020, 8:31 AM

## 2020-10-13 NOTE — Anesthesia Postprocedure Evaluation (Signed)
Anesthesia Post Note  Patient: Emily Mosley  Procedure(s) Performed: AN AD HOC LABOR EPIDURAL  Patient location during evaluation: Mother Baby Anesthesia Type: Epidural Level of consciousness: awake and alert Pain management: pain level controlled Vital Signs Assessment: post-procedure vital signs reviewed and stable Respiratory status: spontaneous breathing, nonlabored ventilation and respiratory function stable Cardiovascular status: stable Postop Assessment: no headache, no backache and epidural receding Anesthetic complications: no   No notable events documented.   Last Vitals:  Vitals:   10/13/20 0311 10/13/20 0808  BP: 116/75 114/75  Pulse: 60 60  Resp: 16 20  Temp: 36.6 C 37.1 C  SpO2: 98% 98%    Last Pain:  Vitals:   10/13/20 0810  TempSrc:   PainSc: 4                  Torrence Hammack [address]

## 2020-10-14 MED ORDER — IBUPROFEN 600 MG PO TABS: 600.0000 mg | ORAL_TABLET | Freq: Four times a day (QID) | ORAL | 0 refills | Status: DC

## 2020-10-14 MED ORDER — IBUPROFEN 600 MG PO TABS
600.0000 mg | ORAL_TABLET | Freq: Four times a day (QID) | ORAL | Status: DC
  Administered 2020-10-14: 600 mg via ORAL
  Filled 2020-10-14: qty 1

## 2020-10-14 NOTE — Progress Notes (Signed)
Pt discharged with infant. Discharge instructions, prescriptions, and follow up appointments given to and reviewed with patient. Pt verbalized understanding. To be escorted out by auxillary.  °

## 2020-10-14 NOTE — Lactation Note (Signed)
This note was copied from a baby's chart. Lactation Consultation Note  Patient Name: Emily Mosley [id number] Date: 10/14/2020 Reason for consult: Follow-up assessment;Term Age:24 hours Awaiting discharge today Maternal Data Does the patient have breastfeeding experience prior to this delivery?: Yes How long did the patient breastfeed?: approx 4 mths  Feeding Mother's Current Feeding Choice: Breast Milk and Formula Mom reports baby latching well today, cluster fed overnight, not as sleepy as yesterday, swallows noted , baby in football hold on right breast  LATCH Score Latch: Grasps breast easily, tongue down, lips flanged, rhythmical sucking.  Audible Swallowing: Spontaneous and intermittent  Type of Nipple: Everted at rest and after stimulation  Comfort (Breast/Nipple): Filling, red/small blisters or bruises, mild/mod discomfort  Hold (Positioning): No assistance needed to correctly position infant at breast.  LATCH Score: 9   Lactation Tools Discussed/Used    Interventions  LC name and no written on white board  Discharge [facility] Program: No  Consult Status Consult Status: PRN Date: 10/14/20 Follow-up type: In-patient    Dyann Kief 10/14/2020, 9:59 AM

## 2020-10-18 ENCOUNTER — Inpatient Hospital Stay: Admit: 2020-10-18 | Payer: BC Managed Care – PPO

## 2020-10-24 ENCOUNTER — Telehealth: Payer: Self-pay

## 2020-10-24 NOTE — Telephone Encounter (Signed)
Pt calling; has FMLA paperwork; doesn't know who to give it to; delivered 10/12/20.  [phone]  Adv to turn paperwork in to the front desk; there are forms we need her to fill out and a fee of $25 to pay.

## 2021-01-12 ENCOUNTER — Other Ambulatory Visit (HOSPITAL_COMMUNITY)
Admission: RE | Admit: 2021-01-12 | Discharge: 2021-01-12 | Disposition: A | Discharge status: Home / Self Care | Payer: BC Managed Care – PPO | Source: Ambulatory Visit | Attending: Obstetrics | Admitting: Obstetrics

## 2021-01-12 ENCOUNTER — Other Ambulatory Visit: Payer: Self-pay

## 2021-01-12 ENCOUNTER — Ambulatory Visit (INDEPENDENT_AMBULATORY_CARE_PROVIDER_SITE_OTHER): Payer: BC Managed Care – PPO | Admitting: Obstetrics

## 2021-01-12 ENCOUNTER — Encounter: Payer: Self-pay | Admitting: Obstetrics

## 2021-01-12 VITALS — BP 100/60 | Ht 64.0 in | Wt 147.0 lb

## 2021-01-12 DIAGNOSIS — Z01419 Encounter for gynecological examination (general) (routine) without abnormal findings: Secondary | ICD-10-CM | POA: Diagnosis not present

## 2021-01-12 DIAGNOSIS — Z30011 Encounter for initial prescription of contraceptive pills: Secondary | ICD-10-CM | POA: Diagnosis not present

## 2021-01-12 DIAGNOSIS — Z30015 Encounter for initial prescription of vaginal ring hormonal contraceptive: Secondary | ICD-10-CM

## 2021-01-12 DIAGNOSIS — Z124 Encounter for screening for malignant neoplasm of cervix: Secondary | ICD-10-CM | POA: Insufficient documentation

## 2021-01-12 MED ORDER — ETONOGESTREL-ETHINYL ESTRADIOL 0.12-0.015 MG/24HR VA RING: VAGINAL_RING | VAGINAL | 3 refills | Status: DC

## 2021-01-12 MED ORDER — NORETHINDRONE 0.35 MG PO TABS: 1.0000 | ORAL_TABLET | Freq: Every day | ORAL | 2 refills | Status: DC

## 2021-01-12 NOTE — Progress Notes (Signed)
Postpartum Visit  Chief Complaint:  Chief Complaint  Patient presents with   Annual Exam    History of Present Illness: Patient is a 25 y.o. [id number] presents for postpartum visit.She is late for her 6 week postpartum visit, having delivered 10/12/2020.  Date of delivery: 10/12/2020 Type of delivery: Vaginal delivery - Vacuum or forceps assisted  no Episiotomy No.  Laceration: no  Pregnancy or labor problems:  no Any problems since the delivery:  no  Newborn Details:  SINGLETON :  1. Baby's name: girl. Birth weight: 2840 gms Maternal Details:  Breast Feeding:  yes Post partum depression/anxiety noted:  no Edinburgh Post-Partum Depression Score:  see scanned note  Date of last PAP: 03/23/2018  normal   Past Medical History:  Diagnosis Date   Shoulder dystocia, delivered     Past Surgical History:  Procedure Laterality Date   APPENDECTOMY     EYE MUSCLE SURGERY     AGE 23   MIrena IUD  2020   WISDOM TOOTH EXTRACTION  2015   ALL FOUR    Prior to Admission medications   Medication Sig Start Date End Date Taking? Authorizing Provider  ibuprofen (ADVIL) 600 MG tablet Take 1 tablet (600 mg total) by mouth every 6 (six) hours. Patient not taking: Reported on 01/12/2021 10/14/20   Mirna Mires, CNM  Prenat-FeCbn-FeAsp-Meth-FA-DHA (PRENATE MINI) 18-0.6-0.4-350 MG CAPS Take 1 capsule by mouth daily. Patient not taking: Reported on 01/12/2021 07/29/17   Nadara Mustard, MD    No Known Allergies   Social History   Socioeconomic History   Marital status: Married    Spouse name: Not on file   Number of children: 1   Years of education: 14   Highest education level: Not on file  Occupational History   Occupation: Teacher, adult education: [employer]  Tobacco Use   Smoking status: Never   Smokeless tobacco: Never  Vaping Use   Vaping Use: Never used  Substance and Sexual Activity   Alcohol use: No   Drug use: No   Sexual activity: Yes    Comment: Pop or nexplanon after  delivery  Other Topics Concern   Not on file  Social History Narrative   Not on file   Social Determinants of Health   Financial Resource Strain: Not on file  Food Insecurity: Not on file  Transportation Needs: Not on file  Physical Activity: Not on file  Stress: Not on file  Social Connections: Not on file  Intimate Partner Violence: Not on file    Family History  Problem Relation Age of Onset   Healthy Mother    Healthy Father    Thyroid disease Maternal Grandmother        HYPO     Review of Systems  Constitutional: Negative.   HENT: Negative.    Eyes: Negative.   Respiratory: Negative.    Cardiovascular: Negative.   Gastrointestinal: Negative.   Genitourinary: Negative.   Musculoskeletal: Negative.   Skin: Negative.   Neurological: Negative.   Endo/Heme/Allergies: Negative.   Psychiatric/Behavioral: Negative.      Physical Exam BP 100/60   Ht 5\' 4"  (1.626 m)   Wt 147 lb (66.7 kg)   Breastfeeding Yes   BMI 25.23 kg/m   OBGyn Exam   Female Chaperone present during breast and/or pelvic exam.  Assessment: 25 y.o. 25 presenting for 6 week postpartum visit  Plan: Problem List Items Addressed This Visit   None    1)  Contraception Education given regarding options for contraception, including oral contraceptives and POPs. She will start with POPs and then switch to the Nuva Ring for a trail  2)  Pap - ASCCP guidelines and rational discussed.  Patient opts for annual screening interval  3) Patient underwent screening for postpartum depression with no concerns noted.  4) Follow up 1 year for routine annual exam  Mirna Mires, CNM  03/06/2021 5:37 PM   01/12/2021 10:48 AM

## 2021-01-19 LAB — CYTOLOGY - PAP
Diagnosis: NEGATIVE
Diagnosis: REACTIVE

## 2021-01-20 ENCOUNTER — Other Ambulatory Visit: Payer: Self-pay | Admitting: Obstetrics

## 2021-01-20 ENCOUNTER — Encounter: Payer: Self-pay | Admitting: Obstetrics

## 2021-01-20 DIAGNOSIS — B3731 Acute candidiasis of vulva and vagina: Secondary | ICD-10-CM

## 2021-01-20 MED ORDER — TERCONAZOLE 0.4 % VA CREA: 1.0000 | TOPICAL_CREAM | Freq: Every day | VAGINAL | 0 refills | Status: DC

## 2021-01-30 ENCOUNTER — Ambulatory Visit: Payer: Self-pay

## 2021-02-01 ENCOUNTER — Encounter: Payer: Self-pay | Admitting: Obstetrics

## 2022-09-28 IMAGING — US US OB FOLLOW-UP
1 series · 13 of 28 positions shown · non-contrast
Comparison: none

CLINICAL DATA: Third trimester pregnancy. Size less than dates.
Evaluate fetal growth.

EXAM:
OBSTETRICAL ULTRASOUND >14 WKS

[Series 1: us ob follow up · 69 acquisitions, 13 frames shown]
[im 3/69]
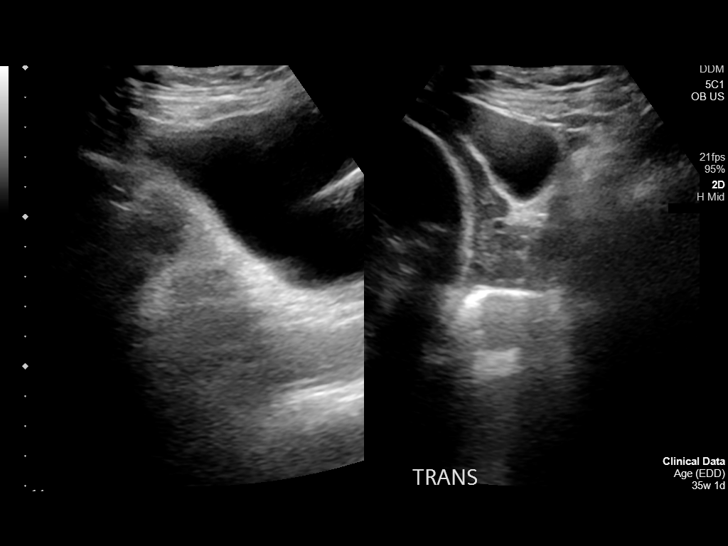
[im 8/69]
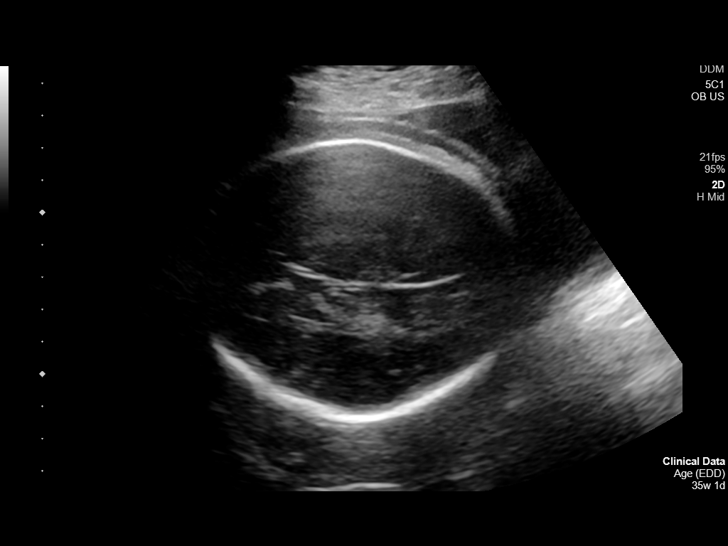
[im 13/69]
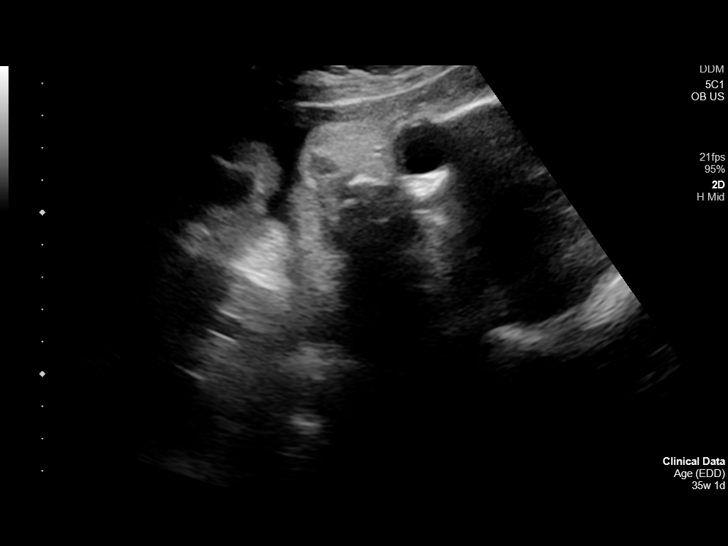
[im 18/69]
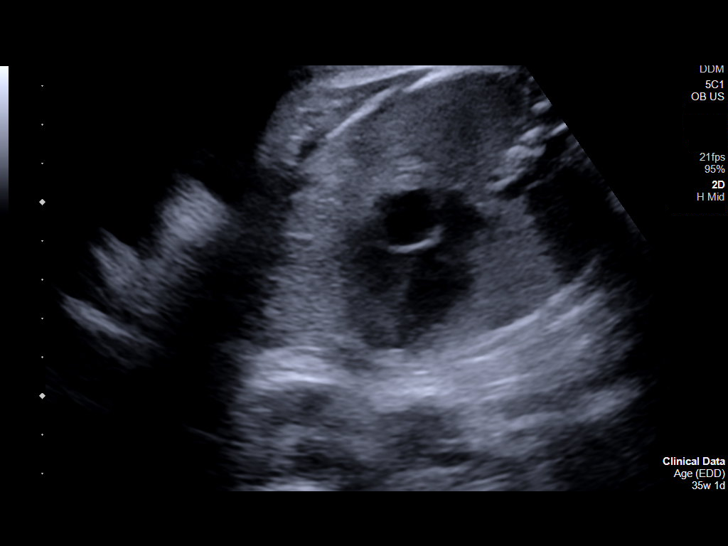
[im 23/69]
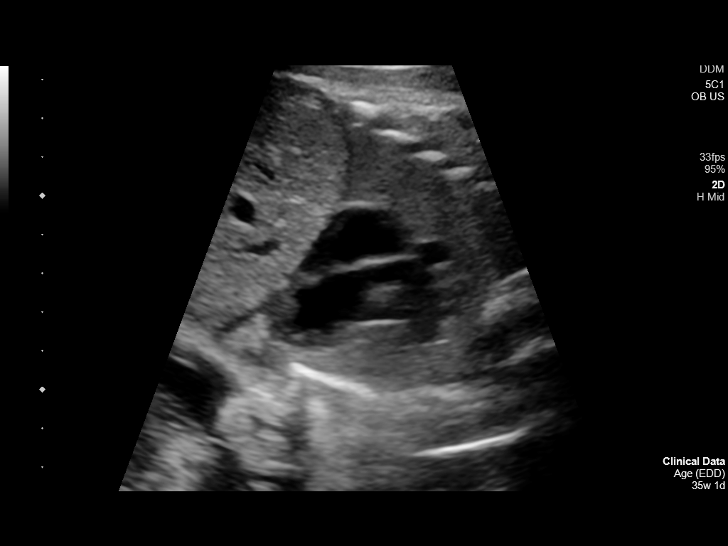
[im 28/69]
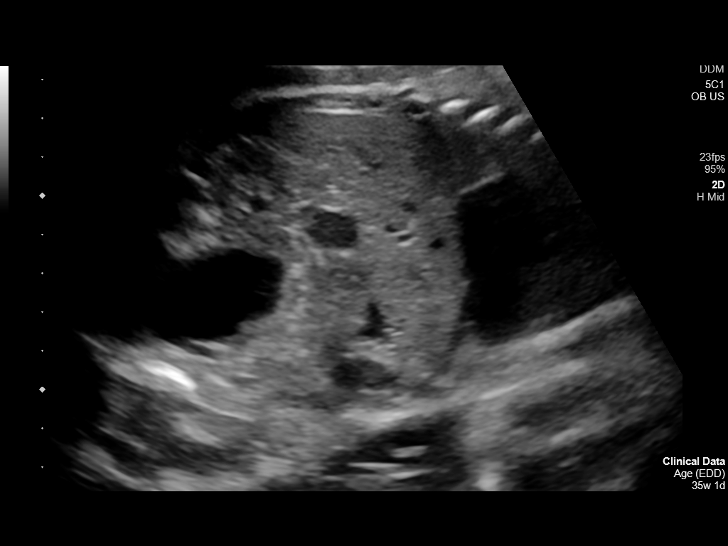
[im 36/69]
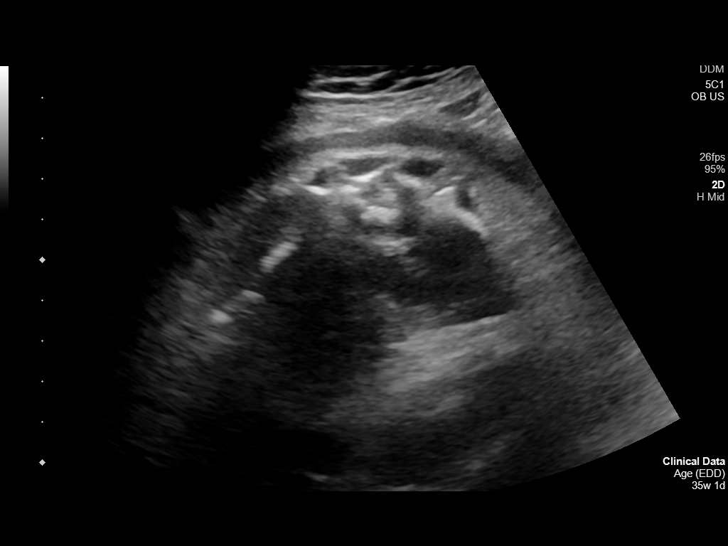
[im 41/69]
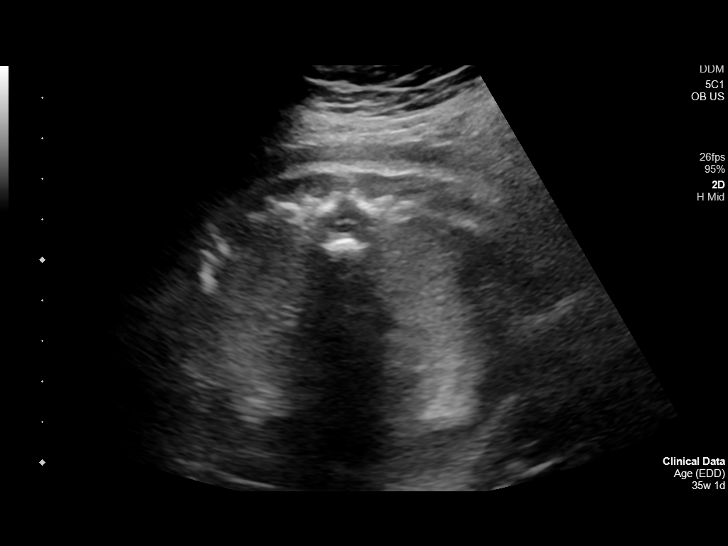
[im 46/69]
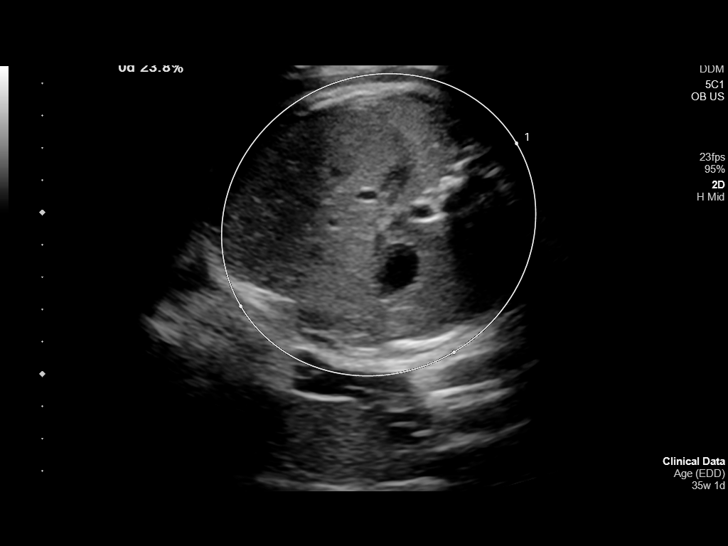
[im 51/69]
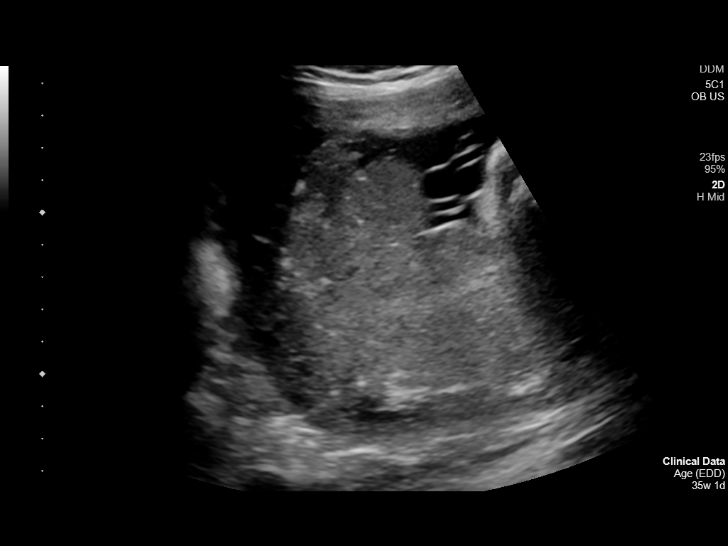
[im 56/69]
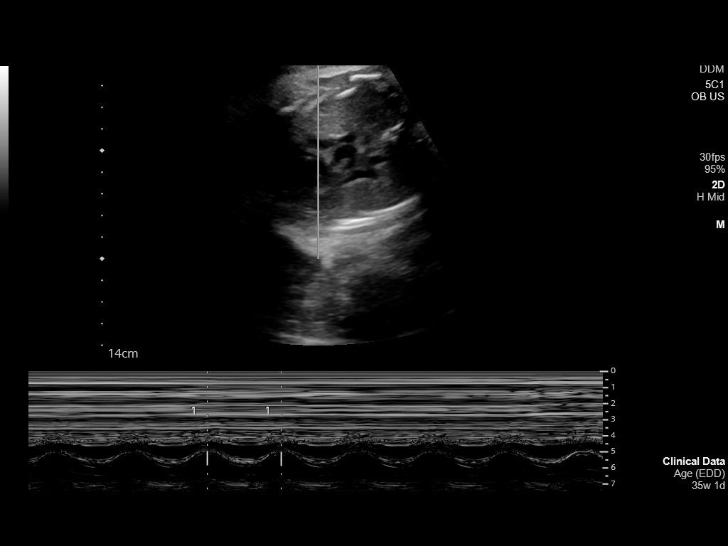
[im 61/69]
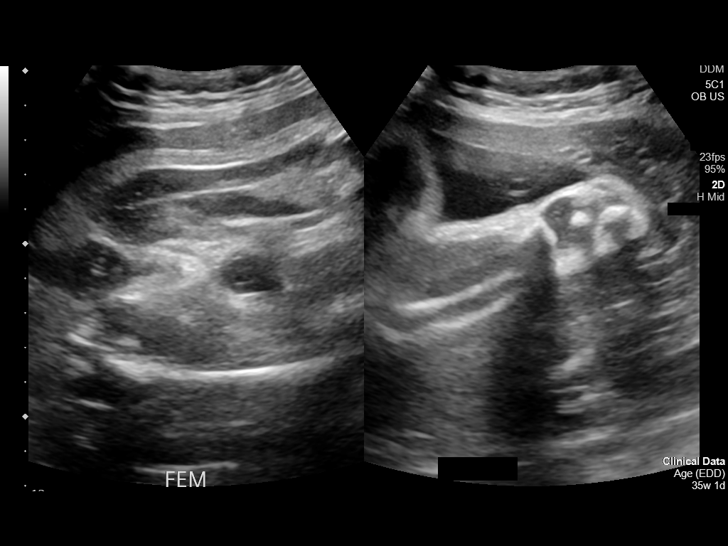
[im 66/69]
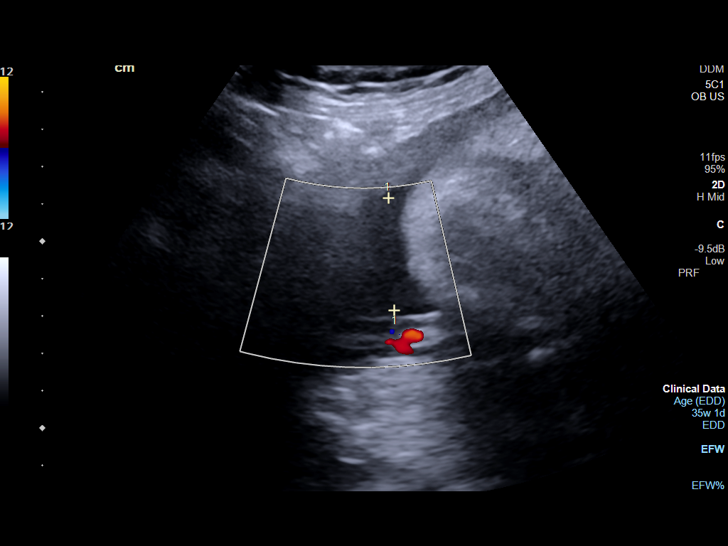

[13 of 28 positions shown; findings below may reference images not displayed]

FINDINGS: Number of Fetuses: 1

Heart Rate:  138 bpm

Movement: Yes

Presentation: Cephalic

Previa: No

Placental Location: Posterior

Amniotic Fluid (Subjective): Within normal limits

Amniotic Fluid (Objective):

Vertical pocket = 4.0cm

AFI = 10.5 cm (5%ile= 7.9 cm, 95%= 24.8 cm for 35 wks)

FETAL BIOMETRY

BPD: 8.5cm 34w 2d

HC:   31.2cm 35w 0d

AC:   30.3cm 34w 2d

FL:   6.7cm 34w 4d

Current Mean GA: 34w 2d US EDC: 10/24/2020

Assigned GA:  35w 1d Assigned EDC: 10/18/2020

Estimated Fetal Weight:  2,429g 27%ile

FETAL ANATOMY

Lateral Ventricles: Appears normal

Thalami/CSP: Appears normal

Posterior Fossa:  Appears normal

Nuchal Region: Not visualized

Upper Lip: Appears normal

Spine: Appears normal

4 Chamber Heart on Left: Appears normal

LVOT: Appears normal

RVOT: Appears normal

Stomach on Left: Appears normal

3 Vessel Cord: Appears normal

Cord Insertion site: Appears normal

Kidneys: Appears normal

Bladder: Appears normal

Extremities: Appears normal

Sex: Not Visualized

Technically difficult due to: Advanced gestational age and fetal
position

Maternal Findings:

Cervix:  4.4 cm TA
IMPRESSION: Assigned GA currently 35 weeks 1 day. Appropriate fetal growth, with
EFW currently at 27 %ile.

No fetal anatomic abnormality identified.

Amniotic fluid volume within normal limits, with AFI of 10.5 cm.

## 2022-11-23 ENCOUNTER — Ambulatory Visit: Payer: BC Managed Care – PPO

## 2022-11-23 VITALS — BP 106/72 | HR 71 | Ht 64.0 in | Wt 133.3 lb

## 2022-11-23 DIAGNOSIS — Z01411 Encounter for gynecological examination (general) (routine) with abnormal findings: Secondary | ICD-10-CM | POA: Diagnosis not present

## 2022-11-23 DIAGNOSIS — N6452 Nipple discharge: Secondary | ICD-10-CM | POA: Diagnosis not present

## 2022-11-23 DIAGNOSIS — Z01419 Encounter for gynecological examination (general) (routine) without abnormal findings: Secondary | ICD-10-CM

## 2022-11-23 NOTE — Progress Notes (Signed)
Outpatient Gynecology Note: Annual Visit  Assessment/Plan:    Emily Mosley is a 27 y.o. female [phone] with normal well-woman gynecologic exam.   Well woman exam - Reviewed health maintenance topics as documented below. - Most recent pap smear in 2022, NILM, repeat cervical cancer screening due 2025 . - STI screening declined.    Nipple discharge in female - Patient has only experienced spontaneous nipple discharge from right nipple, but on exam today, provider was able to express small droplets of white milky discharge from both nipples. Reviewed with patient that bilateral nipple discharge is often physiologic, however, given mother's recent ductal carcinoma diagnosis, patient would prefer imaging. Mammogram ordered as well as prolactin and thyroid labs.      Risk factors identified in Subjective to review: breast cancer in mother Orders Placed This Encounter  Procedures   MM Digital Diagnostic Bilat    Standing Status:   Future    Standing Expiration Date:   11/23/2023    Order Specific Question:   Reason for exam:    Answer:   nipple discharge, family history of ductal carcinoma    Order Specific Question:   Is the patient pregnant?    Answer:   No    Order Specific Question:   Preferred imaging location?    Answer:   [facility]   Prolactin   TSH + Jeiry Birnbaum T4   Current Outpatient Medications  Medication Instructions   etonogestrel-ethinyl estradiol (NUVARING) 0.12-0.015 MG/24HR vaginal ring Insert vaginally and leave in place for 3 consecutive weeks, then remove for 1 week.   ibuprofen (ADVIL) 600 mg, Oral, Every 6 hours   norethindrone (MICRONOR) 0.35 mg, Oral, Daily   Prenat-FeCbn-FeAsp-Meth-FA-DHA (PRENATE MINI) 18-0.6-0.4-350 MG CAPS 1 capsule, Oral, Daily   terconazole (TERAZOL 7) 0.4 % vaginal cream 1 applicator, Vaginal, Daily at bedtime    No follow-ups on file.    Subjective:    Emily Mosley is a 27 y.o. female [phone] who presents  for annual wellness visit.   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 Right breast having some discharge, milky in appearance, spontaneous discharge at night, occasionally during day.   Well Woman Visit:  GYN HISTORY:  No LMP recorded (within weeks).     Menstrual History: OB History     Gravida  3   Para  3   Term  3   Preterm      AB      Living  3      SAB      IAB      Ectopic      Multiple  0   Live Births  3           Menarche age: 38 No LMP recorded (within weeks). Period Cycle (Days): 28 Period Duration (Days): 3 Period Pattern: Regular Menstrual Flow: Moderate Dysmenorrhea: None Intermenstrual bleeding, spotting, or discharge? no Urinary incontinence? some  Sexually active: yes Number of sexual partners: one Gender of sexual Partners: male Dyspareunia? no Last [id number] date 2022 and was normal  History of abnormal Pap: no Gardasil series:  no STI history: no STI/HIV testing or immunizations needed? No.  Contraceptive methods: no method  Health Maintenance > Reviewed breast self-awareness, mother recently diagnosed with ductal breast cancer > History of abnormal mammogram: n/a > Exercise: aerobics and walking, moderately active > Dietary Supplements: none > Body mass index is 22.88 kg/m.  >  Recent dental visit Yes.   > Seat Belt Use: Yes.   > Texting and driving? No. > Guns in the house Yes.  , locked > Concern for alcohol abuse? 0 alcohol   Tobacco or other drug use: denied. Tobacco Use: Low Risk  (11/23/2022)   Patient History    Smoking Tobacco Use: Never    Smokeless Tobacco Use: Never    Passive Exposure: Not on file     PHQ-2 Score: In last two weeks, how often have you felt: Little interest or pleasure in doing things: Not at all (0) Feeling down, depressed or hopeless: Not at all (0) Score: 0  GAD-2 Over the last 2 weeks, how often have you been bothered by the  following problems? Feeling nervous, anxious or on edge: Several days (+1) Not being able to stop or control worrying: Not at all (0)} Score: 1  _________________________________________________________  Current Outpatient Medications  Medication Sig Dispense Refill   etonogestrel-ethinyl estradiol (NUVARING) 0.12-0.015 MG/24HR vaginal ring Insert vaginally and leave in place for 3 consecutive weeks, then remove for 1 week. 3 each 3   ibuprofen (ADVIL) 600 MG tablet Take 1 tablet (600 mg total) by mouth every 6 (six) hours. 30 tablet 0   norethindrone (MICRONOR) 0.35 MG tablet Take 1 tablet (0.35 mg total) by mouth daily. 84 tablet 2   Prenat-FeCbn-FeAsp-Meth-FA-DHA (PRENATE MINI) 18-0.6-0.4-350 MG CAPS Take 1 capsule by mouth daily. 30 capsule 11   terconazole (TERAZOL 7) 0.4 % vaginal cream Place 1 applicator vaginally at bedtime. 45 g 0   No current facility-administered medications for this visit.   No Known Allergies  Past Medical History:  Diagnosis Date   Shoulder dystocia, delivered    Past Surgical History:  Procedure Laterality Date   APPENDECTOMY     EYE MUSCLE SURGERY     AGE 59   MIrena IUD  2020   WISDOM TOOTH EXTRACTION  2015   ALL FOUR   OB History     Gravida  3   Para  3   Term  3   Preterm      AB      Living  3      SAB      IAB      Ectopic      Multiple  0   Live Births  3          Social History   Tobacco Use   Smoking status: Never   Smokeless tobacco: Never  Substance Use Topics   Alcohol use: No   Social History   Substance and Sexual Activity  Sexual Activity Yes   Comment: Pop or nexplanon after delivery    Immunization History  Administered Date(s) Administered   Influenza-Unspecified 11/30/2020   PFIZER Comirnaty(Gray Top)Covid-19 Tri-Sucrose Vaccine 05/14/2019, 07/27/2019   PPD Test 09/08/2016   Tdap 09/08/2016, 09/13/2020     Review Of Systems  Constitutional: Denied constitutional symptoms, night  sweats, recent illness, fatigue, fever, insomnia and weight loss.  Eyes: Denied eye symptoms, eye pain, photophobia, vision change and visual disturbance.  Ears/Nose/Throat/Neck: Denied ear, nose, throat or neck symptoms, hearing loss, nasal discharge, sinus congestion and sore throat.  Cardiovascular: Denied cardiovascular symptoms, arrhythmia, chest pain/pressure, edema, exercise intolerance, orthopnea and palpitations.  Respiratory: Denied pulmonary symptoms, asthma, pleuritic pain, productive sputum, cough, dyspnea and wheezing.  Gastrointestinal: Denied, gastro-esophageal reflux, melena, nausea and vomiting.  Genitourinary: Positive for right nipple discharge and intermittent stress incontinence.  Musculoskeletal: Denied musculoskeletal symptoms,  stiffness, swelling, muscle weakness and myalgia.  Dermatologic: Denied dermatology symptoms, rash and scar.  Neurologic: Denied neurology symptoms, dizziness, headache, neck pain and syncope.  Psychiatric: Denied psychiatric symptoms, anxiety and depression.  Endocrine: Denied endocrine symptoms including hot flashes and night sweats.      Objective:    BP 106/72   Pulse 71   Ht 5\' 4"  (1.626 m)   Wt 133 lb 4.8 oz (60.5 kg)   LMP  (Within Weeks) Comment: PATIENT REPORTS 2 WEEKS AGO WAS LMP  Breastfeeding No   BMI 22.88 kg/m   Constitutional: Well-developed, well-nourished female in no acute distress Neurological: Alert and oriented to person, place, and time Psychiatric: Mood and affect appropriate Skin: No rashes or lesions Neck: Supple without masses. Trachea is midline.Thyroid is normal size without masses Lymphatics: No cervical, axillary, supraclavicular, or inguinal adenopathy noted Respiratory: Clear to auscultation bilaterally. Good air movement with normal work of breathing. Cardiovascular: Regular rate and rhythm. Extremities grossly normal, nontender with no edema; pulses regular Gastrointestinal: Soft, nontender,  nondistended. No masses or hernias appreciated. No hepatosplenomegaly. No fluid wave. No rebound or guarding. Breast Exam: normal appearance, no masses or tenderness, droplets of milky discharge were able to be expressed bilaterally from nipples Genitourinary:  deferred    Perineum/Anus: No lesions Rectal: deferred    Autumn Messing, CNM  11/23/22 4:09 PM

## 2022-11-23 NOTE — Assessment & Plan Note (Signed)
-   Patient has only experienced spontaneous nipple discharge from right nipple, but on exam today, provider was able to express small droplets of white milky discharge from both nipples. Reviewed with patient that bilateral nipple discharge is often physiologic, however, given mother's recent ductal carcinoma diagnosis, patient would prefer imaging. Mammogram ordered as well as prolactin and thyroid labs.

## 2022-11-23 NOTE — Assessment & Plan Note (Signed)
-   Reviewed health maintenance topics as documented below. - Most recent pap smear in 2022, NILM, repeat cervical cancer screening due 2025 . - STI screening declined.

## 2022-11-24 LAB — TSH+FREE T4
Free T4: 1.32 ng/dL (ref 0.82–1.77)
TSH: 1.09 u[IU]/mL (ref 0.450–4.500)

## 2022-11-24 LAB — PROLACTIN: Prolactin: 9.4 ng/mL (ref 4.8–33.4)

## 2022-11-29 ENCOUNTER — Other Ambulatory Visit: Payer: Self-pay

## 2022-11-29 DIAGNOSIS — N6452 Nipple discharge: Secondary | ICD-10-CM

## 2022-12-14 ENCOUNTER — Ambulatory Visit
Admission: RE | Admit: 2022-12-14 | Discharge: 2022-12-14 | Disposition: A | Discharge status: Home / Self Care | Payer: BC Managed Care – PPO | Source: Ambulatory Visit

## 2022-12-14 DIAGNOSIS — N6452 Nipple discharge: Secondary | ICD-10-CM

## 2023-02-13 NOTE — L&D Delivery Note (Signed)
 Delivery Note   Emily Mosley is a 28 y.o. [id number] at [redacted]w[redacted]d Estimated Date of Delivery: 11/13/23  PRE-OPERATIVE DIAGNOSIS:  1) [redacted]w[redacted]d pregnancy.  2) Hx macrosomia  3) Hx shoulder dystocia without neonatal injury  POST-OPERATIVE DIAGNOSIS:  1) [redacted]w[redacted]d pregnancy s/p Vaginal, Spontaneous Same, delivered  Delivery Type: Vaginal, Spontaneous   Delivery Anesthesia: Epidural  Labor Complications:  None    ESTIMATED BLOOD LOSS: 100 ml    FINDINGS:   Information for the patient's newborn:  Prentiss, Hammett [[id number]]  Live born female  Birth Weight: 8 lb 3.2 oz (3720 g) APGAR: 8, 9  Newborn Delivery   Birth date/time: 11/12/2023 22:49:00 Delivery type: Vaginal, Spontaneous      SPECIMENS:   PLACENTA:   Appearance: Intact   Removal: Spontaneous     Disposition: discarded  Cord Blood: not collected, mother of baby B POS  DISPOSITION:  Infant left in stable condition in the delivery room, with L&D personnel and mother,  NARRATIVE SUMMARY: Labor course:  Emily Mosley is a [id number] at [redacted]w[redacted]d who presented to Labor & Delivery for labor management. Her initial cervical exam was 4/80/-1. Labor proceeded spontaneously. She received an epidural with some improvement in labor pain. She was found to be completely dilated at 2232. With excellent maternal pushing effort, she birthed a viable female infant at 34. Head birthed DOA, restituted to LOT. There was a nuchal cord x 1 and somersaulted and reduced after delivery. The shoulders were birthed without difficulty. The infant was placed skin-to-skin with mother. The cord was doubly clamped and cut when pulsations ceased. The placenta delivered spontaneously and was noted to be intact with a 3VC. A perineal and vaginal examination was performed. Episiotomy/Lacerations: None  Tolerated well. Mother and baby left in stable condition.   Harlene LITTIE Cisco, CNM 11/12/2023 11:48 PM

## 2023-03-12 ENCOUNTER — Telehealth: Payer: Self-pay

## 2023-03-12 NOTE — Telephone Encounter (Signed)
Patient has questions about 03/18/23 nurse visit for confirmation of pregnancy. She has had her three other children with [place] and has not had to do this. Advised it is a new policy. She has to be confirmed by [place] or another medical facility. We do not accept home pregnancy tests. Patient voices understanding.

## 2023-03-18 ENCOUNTER — Ambulatory Visit (INDEPENDENT_AMBULATORY_CARE_PROVIDER_SITE_OTHER): Payer: BC Managed Care – PPO

## 2023-03-18 VITALS — BP 103/68 | HR 68 | Resp 16 | Ht 64.0 in | Wt 137.7 lb

## 2023-03-18 DIAGNOSIS — Z3687 Encounter for antenatal screening for uncertain dates: Secondary | ICD-10-CM

## 2023-03-18 DIAGNOSIS — N912 Amenorrhea, unspecified: Secondary | ICD-10-CM

## 2023-03-18 DIAGNOSIS — Z3201 Encounter for pregnancy test, result positive: Secondary | ICD-10-CM | POA: Diagnosis not present

## 2023-03-18 LAB — POCT URINE PREGNANCY: Preg Test, Ur: POSITIVE — AB

## 2023-03-18 NOTE — Progress Notes (Signed)
    NURSE VISIT NOTE  Subjective:    Patient ID: Emily Mosley, female    DOB: [birth year], 28 y.o.   MRN: [id number]  HPI  Patient is a 28 y.o. G36P3003 female who presents for evaluation of amenorrhea. She believes she could be pregnant. Pregnancy is desired. Sexual Activity: single partner, contraception: none. Current symptoms also include: breast tenderness and positive home pregnancy test. Last period was normal.     Objective:    BP 103/68   Pulse 68   Resp 16   Ht 5\' 4"  (1.626 m)   Wt 137 lb 11.2 oz (62.5 kg)   LMP 01/23/2023 (Approximate)   BMI 23.64 kg/m   Lab Review  No results found for any visits on 03/18/23.  Assessment:   1. Amenorrhea     Plan:   Pregnancy Test: Positive  Estimated Date of Delivery: None noted. BP Cuff Measurement taken. Cuff Size Adult Small Encouraged well-balanced diet, plenty of rest when needed, pre-natal vitamins daily and walking for exercise.  Discussed self-help for nausea, avoiding OTC medications until consulting provider or pharmacist, other than Tylenol as needed, minimal caffeine (1-2 cups daily) and avoiding alcohol.   She will schedule her nurse visit @ 7-[redacted] wks pregnant, u/s for dating @10  wk, and NOB visit at [redacted] wk pregnant.    Feel free to call with any questions.     Santiago Bumpers, CMA [facility]

## 2023-03-18 NOTE — Patient Instructions (Signed)
First Trimester of Pregnancy  The first trimester of pregnancy starts on the first day of your last monthly period until the end of week 13. This is months 1 through 3 of pregnancy. A week after a sperm fertilizes an egg, the egg will implant into the wall of the uterus and begin to develop into a baby. Body changes during your first trimester Your body goes through many changes during pregnancy. The changes usually return to normal after your baby is born. Physical changes Your breasts may grow larger and may hurt. The area around your nipples may get darker. Your periods will stop. Your hair and nails may grow faster. You may pee more often. Health changes You may tire easily. Your gums may bleed and may be sensitive when you brush and floss. You may not feel hungry. You may have heartburn. You may throw up or feel like you may throw up. You may want to eat some foods, but not others. You may have headaches. You may have trouble pooping (constipation). Other changes Your emotions may change from day to day. You may have more dreams. Follow these instructions at home: Medicines Talk to your health care provider if you're taking medicines. Ask if the medicines are safe to take during pregnancy. Your provider may change the medicines that you take. Do not take any medicines unless told to by your provider. Take a prenatal vitamin that has at least 600 micrograms (mcg) of folic acid. Do not use herbal medicines, illegal substances, or medicines that are not approved by your provider. Eating and drinking While you're pregnant your body needs extra food for your growing baby. Talk with your provider about what to eat while pregnant. Activity Most women are able to exercise during pregnancy. Exercises may need to change as your pregnancy goes on. Talk to your provider about your activities and exercise routines. Relieving pain and discomfort Wear a good, supportive bra if your breasts  hurt. Rest with your legs raised if you have leg cramps or low back pain. Safety Wear your seatbelt at all times when you're in a car. Talk to your provider if someone hits you, hurts you, or yells at you. Talk with your provider if you're feeling sad or have thoughts of hurting yourself. Lifestyle Certain things can be harmful while you're pregnant. Follow these rules: Do not use hot tubs, steam rooms, or saunas. Do not douche. Do not use tampons or scented pads. Do not drink alcohol,smoke, vape, or use products with nicotine or tobacco in them. If you need help quitting, talk with your provider. Avoid cat litter boxes and soil used by cats. These things carry germs that can cause harm to your pregnancy and your baby. General instructions Keep all follow-up visits. It helps you and your unborn baby stay as healthy as possible. Write down your questions. Take them to your visits. Your provider will: Talk with you about your overall health. Give you advice or refer you to specialists who can help with different needs, including: Prenatal education classes. Mental health and counseling. Foods and healthy eating. Ask for help if you need help with food. Call your dentist and ask to be seen. Brush your teeth with a soft toothbrush. Floss gently. Where to find more information American Pregnancy Association: americanpregnancy.org [employer]: acog.org Office on [employer] Health: [employer] Contact a health care provider if: You feel dizzy, faint, or have a fever. You vomit or have watery poop (diarrhea) for 2  days or more. You have abnormal discharge or bleeding from your vagina. You have pain when you pee or your pee smells bad. You have cramps, pain, or pressure in your belly area. Get help right away if: You have trouble breathing or chest pain. You have any kind of injury, such as from a fall or a car crash. These symptoms may be an  emergency. Get help right away. Call 911. Do not wait to see if the symptoms will go away. Do not drive yourself to the hospital. This information is not intended to replace advice given to you by your health care provider. Make sure you discuss any questions you have with your health care provider. Document Revised: 11/01/2022 Document Reviewed: 06/01/2022 Elsevier Patient Education  2024 Elsevier Inc.Commonly Asked Questions During Pregnancy  Cats: A parasite can be excreted in cat feces.  To avoid exposure you need to have another person empty the little box.  If you must empty the litter box you will need to wear gloves.  Wash your hands after handling your cat.  This parasite can also be found in raw or undercooked meat so this should also be avoided.  Colds, Sore Throats, Flu: Please check your medication sheet to see what you can take for symptoms.  If your symptoms are unrelieved by these medications please call the office.  Dental Work: Most any dental work Agricultural consultant recommends is permitted.  X-rays should only be taken during the first trimester if absolutely necessary.  Your abdomen should be shielded with a lead apron during all x-rays.  Please notify your provider prior to receiving any x-rays.  Novocaine is fine; gas is not recommended.  If your dentist requires a note from [place] prior to dental work please call the office and we will provide one for you.  Exercise: Exercise is an important part of staying healthy during your pregnancy.  You may continue most exercises you were accustomed to prior to pregnancy.  Later in your pregnancy you will most likely notice you have difficulty with activities requiring balance like riding a bicycle.  It is important that you listen to your body and avoid activities that put you at a higher risk of falling.  Adequate rest and staying well hydrated are a must!  If you have questions about the safety of specific activities ask your provider.     Exposure to Children with illness: Try to avoid obvious exposure; report any symptoms to [place] when noted,  If you have chicken pos, red measles or mumps, you should be immune to these diseases.   Please do not take any vaccines while pregnant unless you have checked with your OB provider.  Fetal Movement: After 28 weeks we recommend you do "kick counts" twice daily.  Lie or sit down in a calm quiet environment and count your baby movements "kicks".  You should feel your baby at least 10 times per hour.  If you have not felt 10 kicks within the first hour get up, walk around and have something sweet to eat or drink then repeat for an additional hour.  If count remains less than 10 per hour notify your provider.  Fumigating: Follow your pest control agent's advice as to how long to stay out of your home.  Ventilate the area well before re-entering.  Hemorrhoids:   Most over-the-counter preparations can be used during pregnancy.  Check your medication to see what is safe to use.  It is important to use a  stool softener or fiber in your diet and to drink lots of liquids.  If hemorrhoids seem to be getting worse please call the office.   Hot Tubs:  Hot tubs Jacuzzis and saunas are not recommended while pregnant.  These increase your internal body temperature and should be avoided.  Intercourse:  Sexual intercourse is safe during pregnancy as long as you are comfortable, unless otherwise advised by your provider.  Spotting may occur after intercourse; report any bright red bleeding that is heavier than spotting.  Labor:  If you know that you are in labor, please go to the hospital.  If you are unsure, please call the office and let us help you decide what to do.  Lifting, straining, etc:  If your job requires heavy lifting or straining please check with your provider for any limitations.  Generally, you should not lift items heavier than that you can lift simply with your hands and arms (no back  muscles)  Painting:  Paint fumes do not harm your pregnancy, but may make you ill and should be avoided if possible.  Latex or water based paints have less odor than oils.  Use adequate ventilation while painting.  Permanents & Hair Color:  Chemicals in hair dyes are not recommended as they cause increase hair dryness which can increase hair loss during pregnancy.  " Highlighting" and permanents are allowed.  Dye may be absorbed differently and permanents may not hold as well during pregnancy.  Sunbathing:  Use a sunscreen, as skin burns easily during pregnancy.  Drink plenty of fluids; avoid over heating.  Tanning Beds:  Because their possible side effects are still unknown, tanning beds are not recommended.  Ultrasound Scans:  Routine ultrasounds are performed at approximately 20 weeks.  You will be able to see your baby's general anatomy an if you would like to know the gender this can usually be determined as well.  If it is questionable when you conceived you may also receive an ultrasound early in your pregnancy for dating purposes.  Otherwise ultrasound exams are not routinely performed unless there is a medical necessity.  Although you can request a scan we ask that you pay for it when conducted because insurance does not cover " patient request" scans.  Work: If your pregnancy proceeds without complications you may work until your due date, unless your physician or employer advises otherwise.  Round Ligament Pain/Pelvic Discomfort:  Sharp, shooting pains not associated with bleeding are fairly common, usually occurring in the second trimester of pregnancy.  They tend to be worse when standing up or when you remain standing for long periods of time.  These are the result of pressure of certain pelvic ligaments called "round ligaments".  Rest, Tylenol and heat seem to be the most effective relief.  As the womb and fetus grow, they rise out of the pelvis and the discomfort improves.  Please  notify the office if your pain seems different than that described.  It may represent a more serious condition.  Morning Sickness Morning sickness is when you throw up or feel like you may throw up during pregnancy. This condition often occurs in the morning, but it can also occur at any time of day. Morning sickness is most common during the first three months of pregnancy, but it can go on throughout the pregnancy. Morning sickness is usually harmless. But if you throw up all the time, you should see your health care provider. You may also hear this condition  called nausea and vomiting of pregnancy. What are the causes? The cause of morning sickness is not known. It may be linked to changes in hormones during pregnancy. What increases the risk? You're more likely to have morning sickness if: You had morning sickness in another pregnancy. You're pregnant with more than one baby, such as twins. You had morning sickness in other pregnancies. You have had motion sickness before you were pregnant. You have had bad headaches or migraines before you were pregnant. What are the signs or symptoms? Symptoms of morning sickness include: Feeling like you may throw up. Throwing up. How is this diagnosed? Morning sickness is diagnosed based on your symptoms. How is this treated? Treatment is usually not needed for morning sickness. You may only need to change what you eat. In some cases, your provider may give you: Vitamin B6 supplements. Medicines to prevent throwing up. Ginger. Follow these instructions at home: Medicines Take your medicines only as told by your provider. Do not use any prescription, over-the-counter, or herbal medicines for morning sickness without first talking with your provider. Take prenatal vitamins. These can stop or lessen the symptoms of morning sickness. If you feel like you may throw up after taking prenatal vitamins, take them at night or with a snack. Eating and  drinking     Eat dry toast or crackers before getting out of bed. Eat 5 or 6 small meals a day. Try ginger ale made with real ginger, ginger tea, or ginger candies. Drink fluids throughout the day. Eat protein foods when you need a snack. Nuts, yogurt, and cheese are good choices. Eat dry and bland foods like rice or baked potatoes. Foods that are high in carbohydrates are often helpful. Have someone cook for you if the smell of food makes you want to throw up. Foods to avoid Greasy foods. Fatty foods. Spicy foods. General instructions Try to avoid smells that make you feel sick. Use an air purifier to keep the air in your house free of smells. Try using an acupressure wristband. This is a wristband that's used to treat motion sickness. Try acupuncture. In this treatment, a provider puts thin needles into certain areas of your body to make you feel better. Brush your teeth after throwing up or rinse with a mix of baking soda and water. The acid in throw-up can hurt your teeth. Contact a health care provider if: Your symptoms do not get better. You feel dizzy or light-headed. You're losing weight. Get help right away if: The feeling that you may throw up will not go away, or you can't stop throwing up. You faint. You have very bad pain in your belly. This information is not intended to replace advice given to you by your health care provider. Make sure you discuss any questions you have with your health care provider. Document Revised: 11/01/2022 Document Reviewed: 05/10/2022 Elsevier Patient Education  2024 Elsevier Inc.   Common Medications Safe in Pregnancy  Acne:      Constipation:  Benzoyl Peroxide     Colace  Clindamycin      Dulcolax Suppository  Topica Erythromycin     Fibercon  Salicylic Acid      Metamucil         Miralax AVOID:        Senakot   Accutane    Cough:  Retin-A       Cough Drops  Tetracycline      Phenergan w/ Codeine if  Rx  Minocycline  Robitussin (Plain & DM)  Antibiotics:     Crabs/Lice:  Ceclor       RID  Cephalosporins    AVOID:  E-Mycins      Kwell  Keflex  Macrobid/Macrodantin   Diarrhea:  Penicillin      Kao-Pectate  Zithromax      Imodium AD         PUSH FLUIDS AVOID:       Cipro     Fever:  Tetracycline      Tylenol (Regular or Extra  Minocycline       Strength)  Levaquin      Extra Strength-Do not          Exceed 8 tabs/24 hrs Caffeine:        200mg /day (equiv. To 1 cup of coffee or  approx. 3 12 oz sodas)         Gas: Cold/Hayfever:       Gas-X  Benadryl      Mylicon  Claritin       Phazyme  **Claritin-D        Chlor-Trimeton    Headaches:  Dimetapp      ASA-Free Excedrin  Drixoral-Non-Drowsy     Cold Compress  Mucinex (Guaifenasin)     Tylenol (Regular or Extra  Sudafed/Sudafed-12 Hour     Strength)  **Sudafed PE Pseudoephedrine   Tylenol Cold & Sinus     Vicks Vapor Rub  Zyrtec  **AVOID if Problems With Blood Pressure         Heartburn: Avoid lying down for at least 1 hour after meals  Aciphex      Maalox     Rash:  Milk of Magnesia     Benadryl    Mylanta       1% Hydrocortisone Cream  Pepcid  Pepcid Complete   Sleep Aids:  Prevacid      Ambien   Prilosec       Benadryl  Rolaids       Chamomile Tea  Tums (Limit 4/day)     Unisom         Tylenol PM         Warm milk-add vanilla or  Hemorrhoids:       Sugar for taste  Anusol/Anusol H.C.  (RX: Analapram 2.5%)  Sugar Substitutes:  Hydrocortisone OTC     Ok in moderation  Preparation H      Tucks        Vaseline lotion applied to tissue with wiping    Herpes:     Throat:  Acyclovir      Oragel  Famvir  Valtrex     Vaccines:         Flu Shot Leg Cramps:       *Gardasil  Benadryl      Hepatitis A         Hepatitis B Nasal Spray:       Pneumovax  Saline Nasal Spray     Polio Booster         Tetanus Nausea:       Tuberculosis test or PPD  Vitamin B6 25 mg  TID   AVOID:    Dramamine      *Gardasil  Emetrol       Live Poliovirus  Ginger Root 250 mg QID    MMR (measles, mumps &  High Complex Carbs @ Bedtime    rebella)  Sea Bands-Accupressure    Varicella (Chickenpox)  Unisom 1/2 tab  TID     *No known complications           If received before Pain:         Known pregnancy;   Darvocet       Resume series after  Lortab        Delivery  Percocet    Yeast:   Tramadol      Femstat  Tylenol 3      Gyne-lotrimin  Ultram       Monistat  Vicodin           MISC:         All Sunscreens           Hair Coloring/highlights          Insect Repellant's          (Including DEET)         Mystic Tans

## 2023-03-20 ENCOUNTER — Telehealth (INDEPENDENT_AMBULATORY_CARE_PROVIDER_SITE_OTHER): Payer: BC Managed Care – PPO

## 2023-03-20 DIAGNOSIS — Z3480 Encounter for supervision of other normal pregnancy, unspecified trimester: Secondary | ICD-10-CM

## 2023-03-20 DIAGNOSIS — Z3689 Encounter for other specified antenatal screening: Secondary | ICD-10-CM

## 2023-03-20 NOTE — Patient Instructions (Signed)
 First Trimester of Pregnancy  The first trimester of pregnancy starts on the first day of your last monthly period until the end of week 13. This is months 1 through 3 of pregnancy. A week after a sperm fertilizes an egg, the egg will implant into the wall of the uterus and begin to develop into a baby. Body changes during your first trimester Your body goes through many changes during pregnancy. The changes usually return to normal after your baby is born. Physical changes Your breasts may grow larger and may hurt. The area around your nipples may get darker. Your periods will stop. Your hair and nails may grow faster. You may pee more often. Health changes You may tire easily. Your gums may bleed and may be sensitive when you brush and floss. You may not feel hungry. You may have heartburn. You may throw up or feel like you may throw up. You may want to eat some foods, but not others. You may have headaches. You may have trouble pooping (constipation). Other changes Your emotions may change from day to day. You may have more dreams. Follow these instructions at home: Medicines Talk to your health care provider if you're taking medicines. Ask if the medicines are safe to take during pregnancy. Your provider may change the medicines that you take. Do not take any medicines unless told to by your provider. Take a prenatal vitamin that has at least 600 micrograms (mcg) of folic acid. Do not use herbal medicines, illegal substances, or medicines that are not approved by your provider. Eating and drinking While you're pregnant your body needs extra food for your growing baby. Talk with your provider about what to eat while pregnant. Activity Most women are able to exercise during pregnancy. Exercises may need to change as your pregnancy goes on. Talk to your provider about your activities and exercise routines. Relieving pain and discomfort Wear a good, supportive bra if your breasts  hurt. Rest with your legs raised if you have leg cramps or low back pain. Safety Wear your seatbelt at all times when you're in a car. Talk to your provider if someone hits you, hurts you, or yells at you. Talk with your provider if you're feeling sad or have thoughts of hurting yourself. Lifestyle Certain things can be harmful while you're pregnant. Follow these rules: Do not use hot tubs, steam rooms, or saunas. Do not douche. Do not use tampons or scented pads. Do not drink alcohol,smoke, vape, or use products with nicotine  or tobacco in them. If you need help quitting, talk with your provider. Avoid cat litter boxes and soil used by cats. These things carry germs that can cause harm to your pregnancy and your baby. General instructions Keep all follow-up visits. It helps you and your unborn baby stay as healthy as possible. Write down your questions. Take them to your visits. Your provider will: Talk with you about your overall health. Give you advice or refer you to specialists who can help with different needs, including: Prenatal education classes. Mental health and counseling. Foods and healthy eating. Ask for help if you need help with food. Call your dentist and ask to be seen. Brush your teeth with a soft toothbrush. Floss gently. Where to find more information American Pregnancy Association: americanpregnancy.org [employer]: acog.org Office on [employer] Health: [place] Contact a health care provider if: You feel dizzy, faint, or have a fever. You vomit or have watery poop (diarrhea) for 2  days or more. You have abnormal discharge or bleeding from your vagina. You have pain when you pee or your pee smells bad. You have cramps, pain, or pressure in your belly area. Get help right away if: You have trouble breathing or chest pain. You have any kind of injury, such as from a fall or a car crash. These symptoms may be an  emergency. Get help right away. Call 911. Do not wait to see if the symptoms will go away. Do not drive yourself to the hospital. This information is not intended to replace advice given to you by your health care provider. Make sure you discuss any questions you have with your health care provider. Document Revised: 11/01/2022 Document Reviewed: 06/01/2022 Elsevier Patient Education  2024 Elsevier Inc.   Commonly Asked Questions During Pregnancy  Cats: A parasite can be excreted in cat feces.  To avoid exposure you need to have another person empty the little box.  If you must empty the litter box you will need to wear gloves.  Wash your hands after handling your cat.  This parasite can also be found in raw or undercooked meat so this should also be avoided.  Colds, Sore Throats, Flu: Please check your medication sheet to see what you can take for symptoms.  If your symptoms are unrelieved by these medications please call the office.  Dental Work: Most any dental work agricultural consultant recommends is permitted.  X-rays should only be taken during the first trimester if absolutely necessary.  Your abdomen should be shielded with a lead apron during all x-rays.  Please notify your provider prior to receiving any x-rays.  Novocaine is fine; gas is not recommended.  If your dentist requires a note from us  prior to dental work please call the office and we will provide one for you.  Exercise: Exercise is an important part of staying healthy during your pregnancy.  You may continue most exercises you were accustomed to prior to pregnancy.  Later in your pregnancy you will most likely notice you have difficulty with activities requiring balance like riding a bicycle.  It is important that you listen to your body and avoid activities that put you at a higher risk of falling.  Adequate rest and staying well hydrated are a must!  If you have questions about the safety of specific activities ask your provider.     Exposure to Children with illness: Try to avoid obvious exposure; report any symptoms to us  when noted,  If you have chicken pos, red measles or mumps, you should be immune to these diseases.   Please do not take any vaccines while pregnant unless you have checked with your OB provider.  Fetal Movement: After 28 weeks we recommend you do kick counts twice daily.  Lie or sit down in a calm quiet environment and count your baby movements kicks.  You should feel your baby at least 10 times per hour.  If you have not felt 10 kicks within the first hour get up, walk around and have something sweet to eat or drink then repeat for an additional hour.  If count remains less than 10 per hour notify your provider.  Fumigating: Follow your pest control agent's advice as to how long to stay out of your home.  Ventilate the area well before re-entering.  Hemorrhoids:   Most over-the-counter preparations can be used during pregnancy.  Check your medication to see what is safe to use.  It is important  to use a stool softener or fiber in your diet and to drink lots of liquids.  If hemorrhoids seem to be getting worse please call the office.    Common Medications Safe in Pregnancy  Acne:      Constipation:  Benzoyl Peroxide     Colace  Clindamycin      Dulcolax Suppository  Topica Erythromycin     Fibercon  Salicylic Acid      Metamucil         Miralax AVOID:        Senakot   Accutane    Cough:  Retin-A       Cough Drops  Tetracycline      Phenergan w/ Codeine if Rx  Minocycline      Robitussin (Plain & DM)  Antibiotics:     Crabs/Lice:  Ceclor       RID  Cephalosporins    AVOID:  E-Mycins      Kwell  Keflex   Macrobid/Macrodantin   Diarrhea:  Penicillin      Kao-Pectate  Zithromax      Imodium AD         PUSH FLUIDS AVOID:       Cipro     Fever:  Tetracycline      Tylenol  (Regular or Extra  Minocycline       Strength)  Levaquin      Extra Strength-Do not          Exceed 8 tabs/24  hrs Caffeine:        200mg /day (equiv. To 1 cup of coffee or  approx. 3 12 oz sodas)         Gas: Cold/Hayfever:       Gas-X  Benadryl       Mylicon  Claritin       Phazyme  **Claritin-D        Chlor-Trimeton    Headaches:  Dimetapp      ASA-Free Excedrin  Drixoral-Non-Drowsy     Cold Compress  Mucinex (Guaifenasin)     Tylenol  (Regular or Extra  Sudafed/Sudafed-12 Hour     Strength)  **Sudafed PE Pseudoephedrine   Tylenol  Cold & Sinus     Vicks Vapor Rub  Zyrtec  **AVOID if Problems With Blood Pressure         Heartburn: Avoid lying down for at least 1 hour after meals  Aciphex      Maalox     Rash:  Milk of Magnesia     Benadryl     Mylanta       1% Hydrocortisone Cream  Pepcid  Pepcid Complete   Sleep Aids:  Prevacid      Ambien    Prilosec       Benadryl   Rolaids       Chamomile Tea  Tums (Limit 4/day)     Unisom         Tylenol  PM         Warm milk-add vanilla or  Hemorrhoids:       Sugar for taste  Anusol/Anusol H.C.  (RX: Analapram 2.5%)  Sugar Substitutes:  Hydrocortisone OTC     Ok in moderation  Preparation H      Tucks        Vaseline lotion applied to tissue with wiping    Herpes:     Throat:  Acyclovir      Oragel  Famvir  Valtrex     Vaccines:         Flu  Shot Leg Cramps:       *Gardasil  Benadryl       Hepatitis A         Hepatitis B Nasal Spray:       Pneumovax  Saline Nasal Spray     Polio Booster         Tetanus Nausea:       Tuberculosis test or PPD  Vitamin B6 25 mg TID   AVOID:    Dramamine      *Gardasil  Emetrol       Live Poliovirus  Ginger Root 250 mg QID    MMR (measles, mumps &  High Complex Carbs @ Bedtime    rebella)  Sea Bands-Accupressure    Varicella (Chickenpox)  Unisom 1/2 tab TID     *No known complications           If received before Pain:         Known pregnancy;   Darvocet       Resume series after  Lortab        Delivery  Percocet    Yeast:   Tramadol      Femstat  Tylenol   3      Gyne-lotrimin  Ultram       Monistat  Vicodin           MISC:         All Sunscreens           Hair Coloring/highlights          Insect Repellant's          (Including DEET)         Mystic Tans  Hot Tubs:  Hot tubs Jacuzzis and saunas are not recommended while pregnant.  These increase your internal body temperature and should be avoided.  Intercourse:  Sexual intercourse is safe during pregnancy as long as you are comfortable, unless otherwise advised by your provider.  Spotting may occur after intercourse; report any bright red bleeding that is heavier than spotting.  Labor:  If you know that you are in labor, please go to the hospital.  If you are unsure, please call the office and let us  help you decide what to do.  Lifting, straining, etc:  If your job requires heavy lifting or straining please check with your provider for any limitations.  Generally, you should not lift items heavier than that you can lift simply with your hands and arms (no back muscles)  Painting:  Paint fumes do not harm your pregnancy, but may make you ill and should be avoided if possible.  Latex or water based paints have less odor than oils.  Use adequate ventilation while painting.  Permanents & Hair Color:  Chemicals in hair dyes are not recommended as they cause increase hair dryness which can increase hair loss during pregnancy.   Highlighting and permanents are allowed.  Dye may be absorbed differently and permanents may not hold as well during pregnancy.  Sunbathing:  Use a sunscreen, as skin burns easily during pregnancy.  Drink plenty of fluids; avoid over heating.  Tanning Beds:  Because their possible side effects are still unknown, tanning beds are not recommended.  Ultrasound Scans:  Routine ultrasounds are performed at approximately 20 weeks.  You will be able to see your baby's general anatomy an if you would like to know the gender this can usually be determined as well.  If it is  questionable when you conceived you may also  receive an ultrasound early in your pregnancy for dating purposes.  Otherwise ultrasound exams are not routinely performed unless there is a medical necessity.  Although you can request a scan we ask that you pay for it when conducted because insurance does not cover  patient request scans.  Work: If your pregnancy proceeds without complications you may work until your due date, unless your physician or employer advises otherwise.  Round Ligament Pain/Pelvic Discomfort:  Sharp, shooting pains not associated with bleeding are fairly common, usually occurring in the second trimester of pregnancy.  They tend to be worse when standing up or when you remain standing for long periods of time.  These are the result of pressure of certain pelvic ligaments called round ligaments.  Rest, Tylenol  and heat seem to be the most effective relief.  As the womb and fetus grow, they rise out of the pelvis and the discomfort improves.  Please notify the office if your pain seems different than that described.  It may represent a more serious condition.

## 2023-03-20 NOTE — Progress Notes (Signed)
 New OB Intake  I connected with  Emily Mosley on 03/20/23 at  8:15 AM EST by MyChart Video Visit and verified that I am speaking with the correct person using two identifiers. Nurse is located at [facility] and pt is located at home.  I discussed the limitations, risks, security and privacy concerns of performing an evaluation and management service by telephone and the availability of in person appointments. I also discussed with the patient that there may be a patient responsible charge related to this service. The patient expressed understanding and agreed to proceed.  I explained I am completing New OB Intake today. We discussed her EDD of 10/30/2023 that is based on LMP of 01/23/2023. Pt is G4/P3. I reviewed her allergies, medications, Medical/Surgical/OB history, and appropriate screenings. There are cats in the home in the home  yes If yes patient states that she will be relocating to her mothers Based on history, this is a/an pregnancy uncomplicated . Her obstetrical history is significant for  none .  Patient Active Problem List   Diagnosis Date Noted   Well woman exam 11/23/2022   Nipple discharge in female 11/23/2022   Postpartum care following vaginal delivery 10/14/2020   Encounter for elective induction of labor 10/12/2020   [redacted] weeks gestation of pregnancy 10/12/2020   Supervision of other normal pregnancy, antepartum 07/04/2020   Stress incontinence 06/08/2020   Anxiety 03/14/2020   History of shoulder dystocia in prior pregnancy, currently pregnant in third trimester 12/11/2017    Concerns addressed today  Delivery Plans:  Plans to deliver at [facility]  Anatomy US  Explained first scheduled US  will be around 19 weeks. Patient has a ultrasound scheduled for 03/25/2023. Pt notified to arrive at 9:20AM.  Labs Discussed genetic screening with patient. Patient undecided for genetic testing to be drawn at new OB visit. Discussed possible labs to be  drawn at new OB appointment.  COVID Vaccine Patient has had COVID vaccine.   Social Determinants of Health Food Insecurity: denies food insecurity WIC Referral: Patient is not interested in referral to [facility].  Transportation: Patient denies transportation needs. Childcare: Discussed no children allowed at ultrasound appointments.   First visit review I reviewed new OB appt with pt. I explained she will have blood work and pap smear/pelvic exam if indicated. Explained pt will be seen by Zelda Sebastian HOWARD at first visit 2:55PM; encounter routed to appropriate provider.   Rollo Emily Mosley, CMA 03/20/2023  8:39 AM

## 2023-03-21 ENCOUNTER — Telehealth: Payer: Self-pay

## 2023-03-21 NOTE — Telephone Encounter (Signed)
 Pt called triage stating that she is maybe 7 or 8 weeks and SOB and feels palpations, pt advised to drink plenty of fluids eat a snack. Rest if she can and keep en eye on how she feel. If she has any pain or feels heavy on her chest then go to ER. Pt aware

## 2023-03-25 ENCOUNTER — Ambulatory Visit
Admission: RE | Admit: 2023-03-25 | Discharge: 2023-03-25 | Disposition: A | Discharge status: Home / Self Care | Payer: BC Managed Care – PPO | Source: Ambulatory Visit

## 2023-03-25 ENCOUNTER — Ambulatory Visit: Payer: BC Managed Care – PPO

## 2023-03-25 DIAGNOSIS — Z3687 Encounter for antenatal screening for uncertain dates: Secondary | ICD-10-CM | POA: Insufficient documentation

## 2023-03-25 DIAGNOSIS — N912 Amenorrhea, unspecified: Secondary | ICD-10-CM | POA: Diagnosis present

## 2023-04-15 ENCOUNTER — Encounter: Payer: BC Managed Care – PPO | Admitting: Certified Nurse Midwife

## 2023-05-07 ENCOUNTER — Ambulatory Visit (INDEPENDENT_AMBULATORY_CARE_PROVIDER_SITE_OTHER): Admitting: Certified Nurse Midwife

## 2023-05-07 ENCOUNTER — Encounter: Payer: Self-pay | Admitting: Certified Nurse Midwife

## 2023-05-07 ENCOUNTER — Other Ambulatory Visit (HOSPITAL_COMMUNITY)
Admission: RE | Admit: 2023-05-07 | Discharge: 2023-05-07 | Disposition: A | Discharge status: Home / Self Care | Source: Ambulatory Visit | Attending: Certified Nurse Midwife | Admitting: Certified Nurse Midwife

## 2023-05-07 VITALS — BP 113/74 | HR 85 | Wt 144.9 lb

## 2023-05-07 DIAGNOSIS — Z3480 Encounter for supervision of other normal pregnancy, unspecified trimester: Secondary | ICD-10-CM | POA: Insufficient documentation

## 2023-05-07 DIAGNOSIS — Z3A12 12 weeks gestation of pregnancy: Secondary | ICD-10-CM

## 2023-05-07 DIAGNOSIS — Z3481 Encounter for supervision of other normal pregnancy, first trimester: Secondary | ICD-10-CM

## 2023-05-07 DIAGNOSIS — Z113 Encounter for screening for infections with a predominantly sexual mode of transmission: Secondary | ICD-10-CM | POA: Diagnosis present

## 2023-05-07 DIAGNOSIS — T7589XA Other specified effects of external causes, initial encounter: Secondary | ICD-10-CM

## 2023-05-07 DIAGNOSIS — Z1379 Encounter for other screening for genetic and chromosomal anomalies: Secondary | ICD-10-CM

## 2023-05-07 DIAGNOSIS — Z130 Encounter for screening for diseases of the blood and blood-forming organs and certain disorders involving the immune mechanism: Secondary | ICD-10-CM

## 2023-05-07 DIAGNOSIS — Z0283 Encounter for blood-alcohol and blood-drug test: Secondary | ICD-10-CM

## 2023-05-07 DIAGNOSIS — Z0184 Encounter for antibody response examination: Secondary | ICD-10-CM

## 2023-05-07 NOTE — Assessment & Plan Note (Signed)
 Red flag symptoms reviewed, Pink folder provided and reviewed.

## 2023-05-08 ENCOUNTER — Encounter: Payer: Self-pay | Admitting: Certified Nurse Midwife

## 2023-05-08 LAB — CBC/D/PLT+RPR+RH+ABO+RUBIGG...
Antibody Screen: NEGATIVE
Basophils Absolute: 0 10*3/uL (ref 0.0–0.2)
Basos: 0 %
EOS (ABSOLUTE): 0.2 10*3/uL (ref 0.0–0.4)
Eos: 3 %
HCV Ab: NONREACTIVE
HIV Screen 4th Generation wRfx: NONREACTIVE
Hematocrit: 35.4 % (ref 34.0–46.6)
Hemoglobin: 11.9 g/dL (ref 11.1–15.9)
Hepatitis B Surface Ag: NEGATIVE
Immature Grans (Abs): 0.1 10*3/uL (ref 0.0–0.1)
Immature Granulocytes: 1 %
Lymphocytes Absolute: 1.9 10*3/uL (ref 0.7–3.1)
Lymphs: 25 %
MCH: 29.3 pg (ref 26.6–33.0)
MCHC: 33.6 g/dL (ref 31.5–35.7)
MCV: 87 fL (ref 79–97)
Monocytes Absolute: 0.4 10*3/uL (ref 0.1–0.9)
Monocytes: 5 %
Neutrophils Absolute: 5.2 10*3/uL (ref 1.4–7.0)
Neutrophils: 66 %
Platelets: 158 10*3/uL (ref 150–450)
RBC: 4.06 x10E6/uL (ref 3.77–5.28)
RDW: 13.2 % (ref 11.7–15.4)
RPR Ser Ql: NONREACTIVE
Rh Factor: POSITIVE
Rubella Antibodies, IGG: 1.64 {index} (ref >0.99)
Varicella zoster IgG: REACTIVE
WBC: 7.7 10*3/uL (ref 3.4–10.8)

## 2023-05-08 LAB — CERVICOVAGINAL ANCILLARY ONLY
Chlamydia: NEGATIVE
Comment: NEGATIVE
Comment: NORMAL
Neisseria Gonorrhea: NEGATIVE

## 2023-05-08 LAB — URINALYSIS, ROUTINE W REFLEX MICROSCOPIC
Bilirubin, UA: NEGATIVE
Glucose, UA: NEGATIVE
Ketones, UA: NEGATIVE
Leukocytes,UA: NEGATIVE
Nitrite, UA: NEGATIVE
Protein,UA: NEGATIVE
RBC, UA: NEGATIVE
Specific Gravity, UA: 1.021 (ref 1.005–1.030)
Urobilinogen, Ur: 0.2 mg/dL (ref 0.2–1.0)
pH, UA: 7 (ref 5.0–7.5)

## 2023-05-08 LAB — HCV INTERPRETATION

## 2023-05-08 LAB — HEMOGLOBIN A1C
Est. average glucose Bld gHb Est-mCnc: 88 mg/dL
Hgb A1c MFr Bld: 4.7 % — ABNORMAL LOW (ref 4.8–5.6)

## 2023-05-08 NOTE — Patient Instructions (Signed)

## 2023-05-08 NOTE — Progress Notes (Signed)
 NEW OB HISTORY AND PHYSICAL  SUBJECTIVE:       Emily Mosley is a 28 y.o. [phone] female, Patient's last menstrual period was 01/23/2023 (approximate)., Estimated Date of Delivery: 11/13/23, [redacted]w[redacted]d, presents today for establishment of Prenatal Care. She reports no complaints   Social history Partner/Relationship: husband, Luis Living situation: husband & children Work: Set designer at [employer] Exercise: running, HIIT Substance use: denies T/E/D  Indications for ASA therapy (per uptodate) One of the following: Previous pregnancy with preeclampsia, especially early onset and with an adverse outcome No Multifetal gestation No Chronic hypertension No Type 1 or 2 diabetes mellitus No Chronic kidney disease No Autoimmune disease (antiphospholipid syndrome, systemic lupus erythematosus) No  Two or more of the following: Nulliparity No Obesity (body mass index >30 kg/m2) No Family history of preeclampsia in mother or sister No Age >=35 years No Sociodemographic characteristics (African American race, low socioeconomic level) No Personal risk factors (eg, previous pregnancy with low birth weight or small for gestational age infant, previous adverse pregnancy outcome [eg, stillbirth], interval >10 years between pregnancies) No   Gynecologic History Patient's last menstrual period was 01/23/2023 (approximate). Normal Contraception: none Last Pap: 01/2021. Results were: normal  Obstetric History OB History  Gravida Para Term Preterm AB Living  4 3 3   3   SAB IAB Ectopic Multiple Live Births     0 3    # Outcome Date GA Lbr Len/2nd Weight Sex Type Anes PTL Lv  4 Current           3 Term 10/12/20 [redacted]w[redacted]d / 00:12 6 lb 4.2 oz (2.84 kg) F Vag-Spont EPI  LIV  2 Term 01/30/18 [redacted]w[redacted]d / 00:08 7 lb 10.1 oz (3.46 kg) M Vag-Spont EPI  LIV  1 Term 03/24/14 [redacted]w[redacted]d  9 lb 3.2 oz (4.173 kg) M Vag-Spont   LIV     Birth Comments: SHOULDER DYSTOCIA    Past Medical History:  Diagnosis Date   Encounter  for supervision of other normal pregnancy, first trimester 06/10/2017   Clinic    [facility]    Prenatal Labs      Dating    LMP c/w 6wk [place]    Blood type: B/Positive/-- (05/08 1012)       Genetic Screen    1 Screen:    AFP:     Quad:     NIPS: declines    Antibody:Negative (05/08 1012)      Anatomic [place]         Rubella: 2.41 (05/08 1012) Varicella: Immune (05/08 0000)      GTT    Early:               Third trimester: 56    RPR: Non Reactive (10/03 1115)       Rhogam       Shoulder dystocia, delivered    Supervision of other normal pregnancy, antepartum 07/04/2020   Clinic    [facility], then [facility]      Dating    US/LMP    Blood type:   B+      Genetic Screen    Declined    Antibody: Neg      Anatomic [place]    [facility], nml    Rubella:   Imm Varicella: Imm      GTT      Third trimester: 64    RPR:   NR      Rhogam    n/a  HBsAg:   NR      TDaP vaccine     09/13/2020   Flu Shot:    HIV:   Nrg      Baby Food    Breast    GBS: negative      Contracept    Past Surgical History:  Procedure Laterality Date   APPENDECTOMY     EYE MUSCLE SURGERY     AGE 30   MIrena IUD  2020   WISDOM TOOTH EXTRACTION  2015   ALL FOUR    Current Outpatient Medications on File Prior to Visit  Medication Sig Dispense Refill   Prenatal Vit-Fe Fumarate-FA (MULTIVITAMIN-PRENATAL) 27-0.8 MG TABS tablet Take 1 tablet by mouth daily at 12 noon.     No current facility-administered medications on file prior to visit.    No Known Allergies  Social History   Socioeconomic History   Marital status: Married    Spouse name: Not on file   Number of children: 1   Years of education: 14   Highest education level: Not on file  Occupational History   Occupation: Teacher, adult education: [employer]  Tobacco Use   Smoking status: Never    Passive exposure: Never   Smokeless tobacco: Never  Vaping Use   Vaping status: Never Used  Substance and Sexual Activity   Alcohol use: No   Drug use: No   Sexual activity: Yes     Comment: Pop or nexplanon after delivery  Other Topics Concern   Not on file  Social History Narrative   Not on file   Social Drivers of Health   Financial Resource Strain: Low Risk  (03/20/2023)   Overall Financial Resource Strain (CARDIA)    Difficulty of Paying Living Expenses: Not hard at all  Food Insecurity: No Food Insecurity (03/20/2023)   Hunger Vital Sign    Worried About Running Out of Food in the Last Year: Never true    Ran Out of Food in the Last Year: Never true  Transportation Needs: No Transportation Needs (03/18/2023)   PRAPARE - Administrator, Civil Service (Medical): No    Lack of Transportation (Non-Medical): No  Physical Activity: Sufficiently Active (03/20/2023)   Exercise Vital Sign    Days of Exercise per Week: 7 days    Minutes of Exercise per Session: 30 min  Stress: No Stress Concern Present (03/18/2023)   [employer] of Occupational Health - Occupational Stress Questionnaire    Feeling of Stress : Not at all  Social Connections: Socially Integrated (03/20/2023)   Social Connection and Isolation Panel [NHANES]    Frequency of Communication with Friends and Family: More than three times a week    Frequency of Social Gatherings with Friends and Family: More than three times a week    Attends Religious Services: More than 4 times per year    Active Member of [employer] or Organizations: Yes    Attends Banker Meetings: Never    Marital Status: Married  Catering manager Violence: Not At Risk (03/18/2023)   Humiliation, Afraid, Rape, and Kick questionnaire    Fear of Current or Ex-Partner: No    Emotionally Abused: No    Physically Abused: No    Sexually Abused: No    Family History  Problem Relation Age of Onset   Healthy Mother    Healthy Father    Thyroid disease Maternal Grandmother        HYPO  The following portions of the patient's history were reviewed and updated as appropriate: allergies, current medications, past  OB history, past medical history, past surgical history, past family history, past social history, and problem list.  Constitutional: Denied constitutional symptoms, night sweats, recent illness, fatigue, fever, insomnia and weight loss.  Eyes: Denied eye symptoms, eye pain, photophobia, vision change and visual disturbance.  Ears/Nose/Throat/Neck: Denied ear, nose, throat or neck symptoms, hearing loss, nasal discharge, sinus congestion and sore throat.  Cardiovascular: Denied cardiovascular symptoms, arrhythmia, chest pain/pressure, edema, exercise intolerance, orthopnea and palpitations.  Respiratory: Denied pulmonary symptoms, asthma, pleuritic pain, productive sputum, cough, dyspnea and wheezing.  Gastrointestinal: Denied gastro-esophageal reflux, melena, nausea and vomiting.  Genitourinary: Denied genitourinary symptoms including symptomatic vaginal discharge, pelvic relaxation issues, and urinary complaints.  Musculoskeletal: Denied musculoskeletal symptoms, stiffness, swelling, muscle weakness and myalgia.  Dermatologic: Denied dermatology symptoms, rash and scar.  Neurologic: Denied neurology symptoms, dizziness, headache, neck pain and syncope.  Psychiatric: Denied psychiatric symptoms, anxiety and depression.  Endocrine: Denied endocrine symptoms including hot flashes and night sweats.     OBJECTIVE: Initial Physical Exam (New OB)  GENERAL APPEARANCE: alert, well appearing, in no apparent distress, oriented to person, place and time HEAD: normocephalic, atraumatic BREASTS: no masses noted, no significant tenderness, no palpable axillary nodes, no skin changes LUNGS: clear to auscultation, no wheezes, rales or rhonchi, symmetric air entry HEART: regular rate and rhythm, no murmurs ABDOMEN: soft, nontender, nondistended, no abnormal masses, no epigastric pain NEUROLOGIC: alert, oriented, normal speech, no focal findings or movement disorder noted  PELVIC EXAM deferred FHR:  150  ASSESSMENT: Normal pregnancy   PLAN: Routine prenatal care. We discussed an overview of prenatal care and when to call. Reviewed diet, exercise, and weight gain recommendations in pregnancy. Discussed benefits of breastfeeding and lactation resources at [facility]. I reviewed labs and answered all questions.  1. Supervision of other normal pregnancy, antepartum (Primary) - NOB Panel - Culture, OB Urine - Urinalysis, Routine w reflex microscopic - Cervicovaginal ancillary only - Hemoglobin A1c - MaterniT 21 plus Core, Blood - Monitor Drug Profile 14(MW) - Nicotine screen, urine  2. Encounter for drug screening - Monitor Drug Profile 14(MW) - Nicotine screen, urine  3. Genetic screening - Hemoglobin A1c - MaterniT 21 plus Core, Blood  4. Exposure to cat feces, initial encounter - NOB Panel  5. Screening for iron deficiency anemia - NOB Panel  6. Screening examination for STD (sexually transmitted disease) - Cervicovaginal ancillary only  7. Immunity status testing - NOB Panel  8. [redacted] weeks gestation of pregnancy - NOB Panel - Culture, OB Urine - Urinalysis, Routine w reflex microscopic - Monitor Drug Profile 14(MW) - Nicotine screen, urine   Dominica Severin, CNM

## 2023-05-09 LAB — URINE CULTURE, OB REFLEX: Organism ID, Bacteria: NO GROWTH

## 2023-05-09 LAB — CULTURE, OB URINE

## 2023-05-12 LAB — MATERNIT 21 PLUS CORE, BLOOD
Fetal Fraction: 18
Result (T21): NEGATIVE
Trisomy 13 (Patau syndrome): NEGATIVE
Trisomy 18 (Edwards syndrome): NEGATIVE
Trisomy 21 (Down syndrome): NEGATIVE

## 2023-06-04 ENCOUNTER — Ambulatory Visit (INDEPENDENT_AMBULATORY_CARE_PROVIDER_SITE_OTHER): Admitting: Advanced Practice Midwife

## 2023-06-04 VITALS — BP 115/68 | HR 87 | Wt 149.6 lb

## 2023-06-04 DIAGNOSIS — Z3480 Encounter for supervision of other normal pregnancy, unspecified trimester: Secondary | ICD-10-CM

## 2023-06-04 DIAGNOSIS — Z1379 Encounter for other screening for genetic and chromosomal anomalies: Secondary | ICD-10-CM

## 2023-06-04 DIAGNOSIS — Z3A16 16 weeks gestation of pregnancy: Secondary | ICD-10-CM | POA: Diagnosis not present

## 2023-06-04 DIAGNOSIS — Z369 Encounter for antenatal screening, unspecified: Secondary | ICD-10-CM | POA: Diagnosis not present

## 2023-06-04 NOTE — Progress Notes (Signed)
 Routine Prenatal Care Visit  Subjective  Emily Mosley is a 28 y.o. [phone] at [redacted]w[redacted]d being seen today for ongoing prenatal care.  She is currently monitored for the following issues for this low-risk pregnancy and has Supervision of other normal pregnancy, antepartum and Anxiety on their problem list.  ----------------------------------------------------------------------------------- Patient reports she is doing well and has no questions or concerns. Feels some fetal movement.  Declines AFP today. Contractions: Not present. Vag. Bleeding: None.  Movement: Present. Leaking Fluid denies.  ----------------------------------------------------------------------------------- The following portions of the patient's history were reviewed and updated as appropriate: allergies, current medications, past family history, past medical history, past social history, past surgical history and problem list. Problem list updated.  Objective  Blood pressure 115/68, pulse 87, weight 149 lb 9.6 oz (67.9 kg), last menstrual period 01/23/2023. Pregravid weight 135 lb (61.2 kg) Total Weight Gain 14 lb 9.6 oz (6.623 kg) Urinalysis: Urine Protein    Urine Glucose    Fetal Status: Fetal Heart Rate (bpm): 143   Movement: Present     General:  Alert, oriented and cooperative. Patient is in no acute distress.  Skin: Skin is warm and dry. No rash noted.   Cardiovascular: Normal heart rate noted  Respiratory: Normal respiratory effort, no problems with respiration noted  Abdomen: Soft, gravid, appropriate for gestational age. Pain/Pressure: Absent     Pelvic:  Cervical exam deferred        Extremities: Normal range of motion.  Edema: None  Mental Status: Normal mood and affect. Normal behavior. Normal judgment and thought content.   Assessment   28 y.o. [id number] at [redacted]w[redacted]d by  11/13/2023, by Ultrasound presenting for routine prenatal visit  Plan   G4 Problems (from 03/18/23 to present)     Problem Noted  Diagnosed Resolved   Supervision of other normal pregnancy, antepartum 07/04/2020 by Alben Alma, MD  No   Overview Addendum 06/04/2023 10:04 AM by Angelita Kendall, CNM   Clinical Staff Provider  Office Location  [facility] Dating  10/30/2023, by Last Menstrual Period  Language  English Anatomy US     Flu Vaccine  declined Genetic Screen  NIPS: declines AFP  TDaP vaccine    Hgb A1C or  GTT Early : Third trimester :   Covid Completed 2    LAB RESULTS   Rhogam  B/Positive/-- (03/25 1102)  Blood Type B/Positive/-- (03/25 1102)   RSV  Antibody Negative (03/25 1102)  Feeding Plan breast Rubella 1.64 (03/25 1102)  Contraception undecided RPR Non Reactive (03/25 1102)   Circumcision yes HBsAg Negative (03/25 1102)   Pediatrician  [facility] HIV Non Reactive (03/25 1102)  Support Person spouse Varicella Reactive (03/25 1102)  Prenatal Classes declined GBS  (For PCN allergy, check sensitivities)     Hep C Non Reactive (03/25 1102)   BTL Consent  Pap Diagnosis  Date Value Ref Range Status  01/12/2021      - Negative for Intraepithelial Lesions or Malignancy (NILM)  01/12/2021 - Benign reactive/reparative changes      VBAC Consent NA Hgb Electro      CF      SMA          History of shoulder dystocia  G1 9lbs 3oz, no issues G2 7lbs 1oz           Preterm labor symptoms and general obstetric precautions including but not limited to vaginal bleeding, contractions, leaking of fluid and fetal movement were reviewed in detail with the patient. Please refer  to After Visit Summary for other counseling recommendations.   Return in about 4 weeks (around 07/02/2023) for anatomy scan and rob after.  Angelita Kendall, CNM 06/04/2023 10:09 AM

## 2023-06-06 ENCOUNTER — Other Ambulatory Visit: Payer: Self-pay

## 2023-06-06 DIAGNOSIS — Z3689 Encounter for other specified antenatal screening: Secondary | ICD-10-CM

## 2023-06-27 ENCOUNTER — Ambulatory Visit
Admission: RE | Admit: 2023-06-27 | Discharge: 2023-06-27 | Disposition: A | Discharge status: Home / Self Care | Source: Ambulatory Visit | Attending: Advanced Practice Midwife | Admitting: Advanced Practice Midwife

## 2023-06-27 DIAGNOSIS — Z3689 Encounter for other specified antenatal screening: Secondary | ICD-10-CM | POA: Diagnosis present

## 2023-07-09 ENCOUNTER — Ambulatory Visit (INDEPENDENT_AMBULATORY_CARE_PROVIDER_SITE_OTHER): Admitting: Licensed Practical Nurse

## 2023-07-09 ENCOUNTER — Encounter: Payer: Self-pay | Admitting: Licensed Practical Nurse

## 2023-07-09 VITALS — BP 93/68 | HR 150 | Wt 156.5 lb

## 2023-07-09 DIAGNOSIS — Z3480 Encounter for supervision of other normal pregnancy, unspecified trimester: Secondary | ICD-10-CM | POA: Diagnosis not present

## 2023-07-09 DIAGNOSIS — Z3A21 21 weeks gestation of pregnancy: Secondary | ICD-10-CM | POA: Diagnosis not present

## 2023-07-09 NOTE — Assessment & Plan Note (Addendum)
-  TWG 21 lbs, which is fine  -Plans to breastfeed, nursed her previous children >1 year  -comfort measures for B-H contractions reviewed  -would like to avoid induction

## 2023-07-09 NOTE — Progress Notes (Signed)
    Return Prenatal Note   Subjective   28 y.o. [id number] at [redacted]w[redacted]d presents for this follow-up prenatal visit.  Patient Dong well. Recently went on a [place] cruise. Has noticed some B-H contractions. 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  Patient reports: Movement: Present Contractions: Irritability  Objective   Flow sheet Vitals: Pulse Rate: (!) 150 BP: 93/68 Fundal Height: 23 cm Fetal Heart Rate (bpm): 140 Total weight gain: 21 lb 8 oz (9.752 kg)  General Appearance  No acute distress, well appearing, and well nourished Pulmonary   Normal work of breathing Neurologic   Alert and oriented to person, place, and time Psychiatric   Mood and affect within normal limits  Assessment/Plan   Plan  28 y.o. [id number] at [redacted]w[redacted]d presents for follow-up OB visit. Reviewed prenatal record including previous visit note.  Supervision of other normal pregnancy, antepartum -TWG 21 lbs, which is fine  -Plans to breastfeed, nursed her previous children >1 year  -comfort measures for B-H contractions reviewed  -would like to avoid induction      No orders of the defined types were placed in this encounter.  Return in about 4 weeks (around 08/06/2023) for ROB.   No future appointments.  For next visit:  continue with routine prenatal care     Berkley Breech [facility], CNM  07/08/2509:20 AM

## 2023-08-06 ENCOUNTER — Ambulatory Visit

## 2023-08-06 VITALS — BP 110/73 | HR 86 | Wt 157.0 lb

## 2023-08-06 DIAGNOSIS — Z113 Encounter for screening for infections with a predominantly sexual mode of transmission: Secondary | ICD-10-CM

## 2023-08-06 DIAGNOSIS — Z3482 Encounter for supervision of other normal pregnancy, second trimester: Secondary | ICD-10-CM

## 2023-08-06 DIAGNOSIS — D649 Anemia, unspecified: Secondary | ICD-10-CM

## 2023-08-06 DIAGNOSIS — Z3480 Encounter for supervision of other normal pregnancy, unspecified trimester: Secondary | ICD-10-CM

## 2023-08-06 DIAGNOSIS — Z3A25 25 weeks gestation of pregnancy: Secondary | ICD-10-CM

## 2023-08-06 DIAGNOSIS — Z131 Encounter for screening for diabetes mellitus: Secondary | ICD-10-CM

## 2023-08-06 NOTE — Assessment & Plan Note (Addendum)
-   Prepared for 1 hour glucola, third trimester labs, and Tdap at next visit.  - Urine collected today to complete lab work.  - Reviewed red flag warning signs anticipatory guidance for upcoming prenatal care.

## 2023-08-06 NOTE — Progress Notes (Signed)
    Return Prenatal Note   Assessment/Plan   Plan  28 y.o. [id number] at [redacted]w[redacted]d presents for follow-up OB visit. Reviewed prenatal record including previous visit note.  Supervision of other normal pregnancy, antepartum - Prepared for 1 hour glucola, third trimester labs, and Tdap at next visit.  - Urine collected today to complete lab work.  - Reviewed red flag warning signs anticipatory guidance for upcoming prenatal care.    Orders Placed This Encounter  Procedures   28 Week RH+Panel    Standing Status:   Future    Expected Date:   08/20/2023    Expiration Date:   08/05/2024   Return in about 2 weeks (around 08/20/2023) for ROB with 1 hour glucola.   Future Appointments  Date Time Provider Department Center  08/21/2023  9:40 AM [facility] None  08/21/2023 10:55 AM Dominic, Jinnie Jansky, CNM [facility] None    For next visit:  ROB with 1 hour gluocla, third trimester labs, and Tdap     Subjective   28 y.o. [id number] at [redacted]w[redacted]d presents for this follow-up prenatal visit.  Patient has been feeling more fatigue lately, otherwise doing well.  Patient reports: Movement: Present Contractions: Not present  Objective   Flow sheet Vitals: Pulse Rate: 86 BP: 110/73 Fundal Height: 24 cm Fetal Heart Rate (bpm): 130 Total weight gain: 22 lb (9.979 kg)  General Appearance  No acute distress, well appearing, and well nourished Pulmonary   Normal work of breathing Neurologic   Alert and oriented to person, place, and time Psychiatric   Mood and affect within normal limits  Lauraine PARAS Loa Idler, CNM  08/05/2508:24 AM

## 2023-08-14 LAB — MONITOR DRUG PROFILE 14(MW)

## 2023-08-14 LAB — NICOTINE SCREEN, URINE

## 2023-08-21 ENCOUNTER — Other Ambulatory Visit

## 2023-08-21 ENCOUNTER — Encounter: Admitting: Licensed Practical Nurse

## 2023-09-03 ENCOUNTER — Other Ambulatory Visit

## 2023-09-03 ENCOUNTER — Ambulatory Visit (INDEPENDENT_AMBULATORY_CARE_PROVIDER_SITE_OTHER): Admitting: Licensed Practical Nurse

## 2023-09-03 ENCOUNTER — Encounter: Payer: Self-pay | Admitting: Licensed Practical Nurse

## 2023-09-03 VITALS — BP 112/72 | HR 87 | Wt 161.1 lb

## 2023-09-03 DIAGNOSIS — Z3A29 29 weeks gestation of pregnancy: Secondary | ICD-10-CM | POA: Diagnosis not present

## 2023-09-03 DIAGNOSIS — Z113 Encounter for screening for infections with a predominantly sexual mode of transmission: Secondary | ICD-10-CM

## 2023-09-03 DIAGNOSIS — Z3483 Encounter for supervision of other normal pregnancy, third trimester: Secondary | ICD-10-CM | POA: Diagnosis not present

## 2023-09-03 DIAGNOSIS — Z3480 Encounter for supervision of other normal pregnancy, unspecified trimester: Secondary | ICD-10-CM

## 2023-09-03 DIAGNOSIS — D649 Anemia, unspecified: Secondary | ICD-10-CM

## 2023-09-03 DIAGNOSIS — Z131 Encounter for screening for diabetes mellitus: Secondary | ICD-10-CM

## 2023-09-03 NOTE — Assessment & Plan Note (Signed)
-  28wks labs today -declined TDAP -warning signs reviewed

## 2023-09-03 NOTE — Progress Notes (Signed)
    Return Prenatal Note   Subjective   28 y.o. [id number] at [redacted]w[redacted]d presents for this follow-up prenatal visit.  Patient  Patient reports:Doing well, has frequent B-H contractions-rec [employer], Spending a lot time at the pool with her children. Mood has been good.  Movement: Present Contractions: Irritability  Objective   Flow sheet Vitals: Pulse Rate: 87 BP: 112/72 Fundal Height: 30 cm Fetal Heart Rate (bpm): 135 Total weight gain: 26 lb 1.6 oz (11.8 kg)  General Appearance  No acute distress, well appearing, and well nourished Pulmonary   Normal work of breathing Neurologic   Alert and oriented to person, place, and time Psychiatric   Mood and affect within normal limits  Assessment/Plan   Plan  28 y.o. [id number] at [redacted]w[redacted]d presents for follow-up OB visit. Reviewed prenatal record including previous visit note.  Supervision of other normal pregnancy, antepartum -28wks labs today -declined TDAP -warning signs reviewed      No orders of the defined types were placed in this encounter.  Return in about 2 weeks (around 09/17/2023) for ROB.   No future appointments.  For next visit:  continue with routine prenatal care     JINNIE HERO [facility], CNM  09/02/2508:52 AM

## 2023-09-04 LAB — 28 WEEK RH+PANEL
Basophils Absolute: 0 x10E3/uL (ref 0.0–0.2)
Basos: 0 %
EOS (ABSOLUTE): 0.2 x10E3/uL (ref 0.0–0.4)
Eos: 2 %
Gestational Diabetes Screen: 81 mg/dL (ref 70–139)
HIV Screen 4th Generation wRfx: NONREACTIVE
Hematocrit: 33.9 % — ABNORMAL LOW (ref 34.0–46.6)
Hemoglobin: 11.6 g/dL (ref 11.1–15.9)
Immature Grans (Abs): 0.1 x10E3/uL (ref 0.0–0.1)
Immature Granulocytes: 1 %
Lymphocytes Absolute: 2.1 x10E3/uL (ref 0.7–3.1)
Lymphs: 21 %
MCH: 30.9 pg (ref 26.6–33.0)
MCHC: 34.2 g/dL (ref 31.5–35.7)
MCV: 90 fL (ref 79–97)
Monocytes Absolute: 0.6 x10E3/uL (ref 0.1–0.9)
Monocytes: 6 %
Neutrophils Absolute: 7 x10E3/uL (ref 1.4–7.0)
Neutrophils: 70 %
Platelets: 185 x10E3/uL (ref 150–450)
RBC: 3.76 x10E6/uL — ABNORMAL LOW (ref 3.77–5.28)
RDW: 13.2 % (ref 11.7–15.4)
RPR Ser Ql: NONREACTIVE
WBC: 10 x10E3/uL (ref 3.4–10.8)

## 2023-09-13 NOTE — Progress Notes (Deleted)
    Return Prenatal Note   Subjective   28 y.o. [id number] at [redacted]w[redacted]d presents for this follow-up prenatal visit.  Patient *** Patient reports:    Objective   Flow sheet Vitals:   Total weight gain: 26 lb 1.6 oz (11.8 kg)  General Appearance  No acute distress, well appearing, and well nourished Pulmonary   Normal work of breathing Neurologic   Alert and oriented to person, place, and time Psychiatric   Mood and affect within normal limits  Assessment/Plan   Plan  28 y.o. [id number] at [redacted]w[redacted]d presents for follow-up OB visit. Reviewed prenatal record including previous visit note.  No problem-specific Assessment & Plan notes found for this encounter.      No orders of the defined types were placed in this encounter.  No follow-ups on file.   Future Appointments  Date Time Provider Department Center  09/16/2023 11:15 AM Slaughterbeck, Damien, CNM [facility] None    For next visit:  {[id number]}     Damien Parsley, CNM [facility] 08/01/251:08 PM

## 2023-09-16 ENCOUNTER — Encounter: Admitting: Certified Nurse Midwife

## 2023-10-08 ENCOUNTER — Telehealth: Payer: Self-pay

## 2023-10-08 NOTE — Telephone Encounter (Signed)
 SABRA

## 2023-10-09 NOTE — Telephone Encounter (Signed)
 I called patient she had some concerns about baby possibly being breech. I told her that we usually check for breech positions at 36 - 37 weeks. She has an appointment on 09/02, I told her that she would be evaluated on that day. She had no other concerns and verbalized understanding.

## 2023-10-15 ENCOUNTER — Encounter: Payer: Self-pay | Admitting: Licensed Practical Nurse

## 2023-10-15 ENCOUNTER — Ambulatory Visit (INDEPENDENT_AMBULATORY_CARE_PROVIDER_SITE_OTHER): Admitting: Licensed Practical Nurse

## 2023-10-15 ENCOUNTER — Other Ambulatory Visit (HOSPITAL_COMMUNITY)
Admission: RE | Admit: 2023-10-15 | Discharge: 2023-10-15 | Disposition: A | Discharge status: Home / Self Care | Source: Ambulatory Visit | Attending: Licensed Practical Nurse | Admitting: Licensed Practical Nurse

## 2023-10-15 VITALS — BP 116/82 | HR 82 | Wt 165.1 lb

## 2023-10-15 DIAGNOSIS — Z3480 Encounter for supervision of other normal pregnancy, unspecified trimester: Secondary | ICD-10-CM

## 2023-10-15 DIAGNOSIS — Z113 Encounter for screening for infections with a predominantly sexual mode of transmission: Secondary | ICD-10-CM

## 2023-10-15 DIAGNOSIS — Z3A35 35 weeks gestation of pregnancy: Secondary | ICD-10-CM | POA: Diagnosis present

## 2023-10-15 DIAGNOSIS — Z3483 Encounter for supervision of other normal pregnancy, third trimester: Secondary | ICD-10-CM

## 2023-10-15 NOTE — Assessment & Plan Note (Signed)
-  Her mother will watch her children while in labor, her husband will be her labor support -Discussed potential IOL prior to 41 wks given history of shoulder dystocia -BSUS confirms vertex presentation -36wks labs collected -warning signs reviewed

## 2023-10-15 NOTE — Progress Notes (Signed)
    Return Prenatal Note   Subjective   28 y.o. [id number] at [redacted]w[redacted]d presents for this follow-up prenatal visit.  Patient  Patient reports:Doing well. Has some heartburn. Admits she is feeling anxious about giving birth as she had a shoulder dystocia with her first child, that child was over 9lbs, her 2 other children were 7lbs and born around [place]. She has noticed the fetus kicks I her upper abdomen, is curious about the fetus's position.  Movement: Present Contractions: Irritability  Objective   Flow sheet Vitals: Pulse Rate: 82 BP: 116/82 Fundal Height: 36 cm Fetal Heart Rate (bpm): 135 Presentation: Vertex Total weight gain: 30 lb 1.6 oz (13.7 kg)  General Appearance  No acute distress, well appearing, and well nourished Pulmonary   Normal work of breathing Neurologic   Alert and oriented to person, place, and time Psychiatric   Mood and affect within normal limits   Assessment/Plan   Plan  28 y.o. [id number] at [redacted]w[redacted]d presents for follow-up OB visit. Reviewed prenatal record including previous visit note.  Supervision of other normal pregnancy, antepartum -Her mother will watch her children while in labor, her husband will be her labor support -Discussed potential IOL prior to 41 wks given history of shoulder dystocia -BSUS confirms vertex presentation -36wks labs collected -warning signs reviewed       Orders Placed This Encounter  Procedures   Strep Gp B NAA   Return in about 1 week (around 10/22/2023) for ROB.   Future Appointments  Date Time Provider Department Center  10/22/2023 10:15 AM Lynda Bradley, CNM [facility] None    For next visit:  continue with routine prenatal care     JINNIE HERO [facility], CNM  10/15/2510:22 PM

## 2023-10-16 LAB — CERVICOVAGINAL ANCILLARY ONLY
Chlamydia: NEGATIVE
Comment: NEGATIVE
Comment: NORMAL
Neisseria Gonorrhea: NEGATIVE

## 2023-10-17 LAB — STREP GP B NAA: Strep Gp B NAA: NEGATIVE

## 2023-10-22 ENCOUNTER — Ambulatory Visit: Admitting: Advanced Practice Midwife

## 2023-10-22 ENCOUNTER — Encounter: Payer: Self-pay | Admitting: Advanced Practice Midwife

## 2023-10-22 VITALS — BP 98/71 | HR 92 | Wt 168.4 lb

## 2023-10-22 DIAGNOSIS — Z3A36 36 weeks gestation of pregnancy: Secondary | ICD-10-CM | POA: Diagnosis not present

## 2023-10-22 DIAGNOSIS — Z3483 Encounter for supervision of other normal pregnancy, third trimester: Secondary | ICD-10-CM | POA: Diagnosis not present

## 2023-10-22 NOTE — Patient Instructions (Signed)
 Signs and Symptoms of Labor Labor is the body's natural process of moving the baby and the placenta out of the uterus. The process of labor usually starts when the baby is full-term, between 74 and 41 weeks of pregnancy. Signs and symptoms that you are close to going into labor As your body prepares for labor and the birth of your baby, you may notice the following symptoms in the weeks and days before true labor starts: Passing a small amount of thick, bloody mucus from your vagina. This is called normal bloody show or losing your mucus plug. This may happen more than a week before labor begins, or right before labor begins, as the opening of the cervix starts to widen (dilate). For some women, the entire mucus plug passes at once. For others, pieces of the mucus plug may gradually pass over several days. Your baby moving (dropping) lower in your pelvis to get into position for birth (lightening). When this happens, you may feel more pressure on your bladder and pelvic bone and less pressure on your ribs. This may make it easier to breathe. It may also cause you to need to urinate more often and have problems with bowel movements. Having "practice contractions," also called Braxton Hicks contractions or false labor. These occur at irregular (unevenly spaced) intervals that are more than 10 minutes apart. False labor contractions are common after exercise or sexual activity. They will stop if you change position, rest, or drink fluids. These contractions are usually mild and do not get stronger over time. They may feel like: A backache or back pain. Mild cramps, similar to menstrual cramps. Tightening or pressure in your abdomen. Other early symptoms include: Nausea or loss of appetite. Diarrhea. Having a sudden burst of energy, or feeling very tired. Mood changes. Having trouble sleeping. Signs and symptoms that labor has begun Signs that you are in labor may include: Having contractions that come  at regular (evenly spaced) intervals and increase in intensity. This may feel like more intense tightening or pressure in your abdomen that moves to your back. Contractions may also feel like rhythmic pain in your upper thighs or back that comes and goes at regular intervals. If you are delivering for the first time, this change in intensity of contractions often occurs at a more gradual pace. If you have given birth before, you may notice a more rapid progression of contraction changes. Feeling pressure in the vaginal area. Your water breaking (rupture of membranes). This is when the sac of fluid that surrounds your baby breaks. Fluid leaking from your vagina may be clear or blood-tinged. Labor usually starts within 24 hours of your water breaking, but it may take longer to begin. Some people may feel a sudden gush of fluid; others may notice repeatedly damp underwear. Follow these instructions at home:  When labor starts, or if your water breaks, call your health care provider or nurse care line. Based on your situation, they will determine when you should go in for an exam. During early labor, you may be able to rest and manage symptoms at home. Some strategies to try at home include: Breathing and relaxation techniques. Taking a warm bath or shower. Listening to music. Using a heating pad on the lower back for pain. If directed, apply heat to the area as often as told by your health care provider. Use the heat source that your health care provider recommends, such as a moist heat pack or a heating pad. Place a  towel between your skin and the heat source. Leave the heat on for 20-30 minutes. Remove the heat if your skin turns bright red. This is especially important if you are unable to feel pain, heat, or cold. You have a greater risk of getting burned. Contact a health care provider if: Your labor has started. Your water breaks. You have nausea, vomiting, or diarrhea. Get help right away  if: You have painful, regular contractions that are 5 minutes apart or less. Labor starts before you are [redacted] weeks along in your pregnancy. You have a fever. You have bright red blood coming from your vagina. You do not feel your baby moving. You have a severe headache with or without vision problems. You have chest pain or shortness of breath. These symptoms may represent a serious problem that is an emergency. Do not wait to see if the symptoms will go away. Get medical help right away. Call your local emergency services (911 in the [place] Do not drive yourself to the hospital. Summary Labor is your body's natural process of moving your baby and the placenta out of your uterus. The process of labor usually starts when your baby is full-term, between 25 and 40 weeks of pregnancy. When labor starts, or if your water breaks, call your health care provider or nurse care line. Based on your situation, they will determine when you should go in for an exam. This information is not intended to replace advice given to you by your health care provider. Make sure you discuss any questions you have with your health care provider. Document Revised: 06/14/2020 Document Reviewed: 06/14/2020 Elsevier Patient Education  2024 [employer].

## 2023-10-22 NOTE — Progress Notes (Signed)
 Routine Prenatal Care Visit  Subjective  Emily Mosley is a 28 y.o. [phone] at [redacted]w[redacted]d being seen today for ongoing prenatal care.  She is currently monitored for the following issues for this low-risk pregnancy and has Supervision of other normal pregnancy, antepartum; Anxiety; and [redacted] weeks gestation of pregnancy on their problem list.  ----------------------------------------------------------------------------------- Patient reports she is doing well. Having [employer] contractions. Her questions regarding spontaneous labor vs IOL are answered.  Contractions: Not present. Vag. Bleeding: None.  Movement: Present. Leaking Fluid denies.  ----------------------------------------------------------------------------------- The following portions of the patient's history were reviewed and updated as appropriate: allergies, current medications, past family history, past medical history, past social history, past surgical history and problem list. Problem list updated.  Objective  Blood pressure 98/71, pulse 92, weight 168 lb 6.4 oz (76.4 kg), last menstrual period 01/23/2023. Pregravid weight 135 lb (61.2 kg) Total Weight Gain 33 lb 6.4 oz (15.2 kg) Urinalysis: Urine Protein    Urine Glucose    Fetal Status: Fetal Heart Rate (bpm): 134 Fundal Height: 37 cm Movement: Present     General:  Alert, oriented and cooperative. Patient is in no acute distress.  Skin: Skin is warm and dry. No rash noted.   Cardiovascular: Normal heart rate noted  Respiratory: Normal respiratory effort, no problems with respiration noted  Abdomen: Soft, gravid, appropriate for gestational age. Pain/Pressure: Present (pressure intermittent)     Pelvic:  Cervical exam deferred        Extremities: Normal range of motion.  Edema: None  Mental Status: Normal mood and affect. Normal behavior. Normal judgment and thought content.   Assessment   28 y.o. [id number] at [redacted]w[redacted]d by  11/13/2023, by Ultrasound presenting for  routine prenatal visit  Plan   G4 Problems (from 03/18/23 to present)     Problem Noted Diagnosed Resolved   Supervision of other normal pregnancy, antepartum 07/04/2020 by Arloa Lamar SQUIBB, MD  No   Overview Addendum 10/22/2023 10:51 AM by Taft Salines, LPN   Clinical Staff Provider  Office Location  [facility] Dating  10/30/2023, by Last Menstrual Period  Language  English Anatomy US   Normal  Flu Vaccine  declined Genetic Screen  NIPS: neg/female, declines AFP  TDaP vaccine  declined Hgb A1C or  GTT Early : Third trimester : 43  Covid Completed 2    LAB RESULTS   Rhogam  B/Positive/-- (03/25 1102)  Blood Type B/Positive/-- (03/25 1102)   RSV  Antibody Negative (03/25 1102)  Feeding Plan breast Rubella 1.64 (03/25 1102)  Contraception Vasectomy? RPR Non Reactive (07/22 1136)   Circumcision yes HBsAg Negative (03/25 1102)   Pediatrician  [facility] HIV Non Reactive (07/22 1136)  Support Person spouse Varicella Reactive (03/25 1102)  Prenatal Classes declined GBS Negative/-- (09/02 1407)(For PCN allergy, check sensitivities)     Hep C Non Reactive (03/25 1102)   BTL Consent  Pap Diagnosis  Date Value Ref Range Status  01/12/2021      - Negative for Intraepithelial Lesions or Malignancy (NILM)  01/12/2021 - Benign reactive/reparative changes      VBAC Consent NA Hgb Electro      CF      SMA          History of shoulder dystocia  G1 9lbs 3oz, no issues G2 7lbs 1oz           Preterm labor symptoms and general obstetric precautions including but not limited to vaginal bleeding, contractions, leaking of fluid and fetal  movement were reviewed in detail with the patient. Please refer to After Visit Summary for other counseling recommendations.   Return in about 1 week (around 10/29/2023) for ROB.  Slater Rains, CNM 10/22/2023 11:01 AM

## 2023-10-24 NOTE — Progress Notes (Addendum)
    Return Prenatal Note   Subjective   28 y.o. [id number] at [redacted]w[redacted]d presents for this follow-up prenatal visit.  Patient is doing well. She has been having contractions on and off x 2 days.  Patient reports: Movement: Present Contractions: Irritability  Objective   Flow sheet Vitals: Pulse Rate: 86 BP: 110/68 Fundal Height: 37 cm Fetal Heart Rate (bpm): 145 Total weight gain: 32 lb 3.2 oz (14.6 kg)  General Appearance  No acute distress, well appearing, and well nourished Pulmonary   Normal work of breathing Neurologic   Alert and oriented to person, place, and time Psychiatric   Mood and affect within normal limits   Assessment/Plan   Plan  28 y.o. [id number] at [redacted]w[redacted]d presents for follow-up OB visit. Reviewed prenatal record including previous visit note.  Supervision of other normal pregnancy, antepartum Reports some irregular contractions over the past few days.  Having some ligament pain. Reviewed comfort measures.  Reviewed labor warning signs and expectations for birth. Instructed to call office or come to hospital with persistent headache, vision changes, regular contractions, leaking of fluid, decreased fetal movement or vaginal bleeding.      Future Appointments  Date Time Provider Department Center  11/04/2023  3:35 PM Shambhavi Salley, Damien, CNM [facility] None     For next visit:  continue with routine prenatal care   Damien Parsley, CNM [facility] 09/22/253:30 PM

## 2023-10-28 ENCOUNTER — Ambulatory Visit: Admitting: Certified Nurse Midwife

## 2023-10-28 VITALS — BP 110/68 | HR 86 | Wt 167.2 lb

## 2023-10-28 DIAGNOSIS — Z3480 Encounter for supervision of other normal pregnancy, unspecified trimester: Secondary | ICD-10-CM

## 2023-10-28 DIAGNOSIS — Z3A37 37 weeks gestation of pregnancy: Secondary | ICD-10-CM | POA: Diagnosis not present

## 2023-10-28 DIAGNOSIS — Z3483 Encounter for supervision of other normal pregnancy, third trimester: Secondary | ICD-10-CM

## 2023-10-28 NOTE — Assessment & Plan Note (Signed)
 Reports some irregular contractions over the past few days.  Having some ligament pain. Reviewed comfort measures.  Reviewed labor warning signs and expectations for birth. Instructed to call office or come to hospital with persistent headache, vision changes, regular contractions, leaking of fluid, decreased fetal movement or vaginal bleeding.

## 2023-11-04 ENCOUNTER — Ambulatory Visit (INDEPENDENT_AMBULATORY_CARE_PROVIDER_SITE_OTHER): Admitting: Certified Nurse Midwife

## 2023-11-04 VITALS — BP 131/84 | HR 81 | Wt 169.6 lb

## 2023-11-04 DIAGNOSIS — Z3A38 38 weeks gestation of pregnancy: Secondary | ICD-10-CM

## 2023-11-04 DIAGNOSIS — Z3483 Encounter for supervision of other normal pregnancy, third trimester: Secondary | ICD-10-CM

## 2023-11-04 DIAGNOSIS — Z3480 Encounter for supervision of other normal pregnancy, unspecified trimester: Secondary | ICD-10-CM

## 2023-11-04 NOTE — Assessment & Plan Note (Signed)
 Emily Mosley getting more uncomfortable with pelvic pain with advancing pregnancy. Support offered.  Baby feels ROP position. Reviewed ways to encourage baby to move.  Will desires membrane sweep at NV and 41 week IOL to be scheduled.  Reviewed labor warning signs and expectations for birth. Instructed to call office or come to hospital with persistent headache, vision changes, regular contractions, leaking of fluid, decreased fetal movement or vaginal bleeding.

## 2023-11-04 NOTE — Progress Notes (Signed)
    Return Prenatal Note   Subjective   28 y.o. [id number] at [redacted]w[redacted]d presents for this follow-up prenatal visit.  Patient is doing well. She has no new concerns. Patient reports: Movement: Present Contractions: Irregular  Objective   Flow sheet Vitals: Pulse Rate: 81 BP: 131/84 Fundal Height: 39 cm Fetal Heart Rate (bpm): 140 Total weight gain: 34 lb 9.6 oz (15.7 kg)  General Appearance  No acute distress, well appearing, and well nourished Pulmonary   Normal work of breathing Neurologic   Alert and oriented to person, place, and time Psychiatric   Mood and affect within normal limits   Assessment/Plan   Plan  28 y.o. [id number] at [redacted]w[redacted]d presents for follow-up OB visit. Reviewed prenatal record including previous visit note.  Supervision of other normal pregnancy, antepartum Adya getting more uncomfortable with pelvic pain with advancing pregnancy. Support offered.  Baby feels ROP position. Reviewed ways to encourage baby to move.  Will desires membrane sweep at NV and 41 week IOL to be scheduled.  Reviewed labor warning signs and expectations for birth. Instructed to call office or come to hospital with persistent headache, vision changes, regular contractions, leaking of fluid, decreased fetal movement or vaginal bleeding.       Future Appointments  Date Time Provider Department Center  11/13/2023  2:35 PM Dominic, Jinnie Jansky, CNM [facility] None     For next visit:  continue with routine prenatal care    Damien Parsley, CNM [facility] 09/22/256:21 PM

## 2023-11-12 ENCOUNTER — Other Ambulatory Visit: Payer: Self-pay

## 2023-11-12 ENCOUNTER — Encounter: Payer: Self-pay | Admitting: Certified Nurse Midwife

## 2023-11-12 ENCOUNTER — Inpatient Hospital Stay: Admitting: Anesthesiology

## 2023-11-12 ENCOUNTER — Inpatient Hospital Stay
Admission: EM | Admit: 2023-11-12 | Discharge: 2023-11-14 | DRG: 807 | Disposition: A | Discharge status: Home / Self Care | Attending: Obstetrics | Admitting: Obstetrics

## 2023-11-12 DIAGNOSIS — O479 False labor, unspecified: Secondary | ICD-10-CM | POA: Diagnosis present

## 2023-11-12 DIAGNOSIS — Z3480 Encounter for supervision of other normal pregnancy, unspecified trimester: Principal | ICD-10-CM

## 2023-11-12 DIAGNOSIS — Z3A39 39 weeks gestation of pregnancy: Secondary | ICD-10-CM | POA: Diagnosis not present

## 2023-11-12 DIAGNOSIS — O26893 Other specified pregnancy related conditions, third trimester: Secondary | ICD-10-CM | POA: Diagnosis present

## 2023-11-12 LAB — TYPE AND SCREEN
ABO/RH(D): B POS
Antibody Screen: NEGATIVE

## 2023-11-12 LAB — CBC
HCT: 36.7 % (ref 36.0–46.0)
Hemoglobin: 12.7 g/dL (ref 12.0–15.0)
MCH: 29.2 pg (ref 26.0–34.0)
MCHC: 34.6 g/dL (ref 30.0–36.0)
MCV: 84.4 fL (ref 80.0–100.0)
Platelets: 216 K/uL (ref 150–400)
RBC: 4.35 MIL/uL (ref 3.87–5.11)
RDW: 12.4 % (ref 11.5–15.5)
WBC: 11.6 K/uL — ABNORMAL HIGH (ref 4.0–10.5)
nRBC: 0 % (ref 0.0–0.2)

## 2023-11-12 MED ORDER — ONDANSETRON HCL 4 MG/2ML IJ SOLN: 4.0000 mg | Freq: Four times a day (QID) | INTRAMUSCULAR | Status: DC | PRN

## 2023-11-12 MED ORDER — SODIUM CHLORIDE 0.9 % IV SOLN
INTRAVENOUS | Status: DC | PRN
  Administered 2023-11-12: 5 mL via EPIDURAL
  Administered 2023-11-12: 3 mL via EPIDURAL

## 2023-11-12 MED ORDER — ACETAMINOPHEN 500 MG PO TABS: 1000.0000 mg | ORAL_TABLET | Freq: Four times a day (QID) | ORAL | Status: DC | PRN

## 2023-11-12 MED ORDER — OXYTOCIN-SODIUM CHLORIDE 30-0.9 UT/500ML-% IV SOLN
2.5000 [IU]/h | INTRAVENOUS | Status: DC
  Filled 2023-11-12: qty 500

## 2023-11-12 MED ORDER — SOD CITRATE-CITRIC ACID 500-334 MG/5ML PO SOLN: 30.0000 mL | ORAL | Status: DC | PRN

## 2023-11-12 MED ORDER — DIPHENHYDRAMINE HCL 50 MG/ML IJ SOLN: 12.5000 mg | INTRAMUSCULAR | Status: DC | PRN

## 2023-11-12 MED ORDER — OXYTOCIN BOLUS FROM INFUSION: 333.0000 mL | Freq: Once | INTRAVENOUS | Status: DC

## 2023-11-12 MED ORDER — EPHEDRINE 5 MG/ML INJ: 10.0000 mg | INTRAVENOUS | Status: DC | PRN

## 2023-11-12 MED ORDER — PHENYLEPHRINE 80 MCG/ML (10ML) SYRINGE FOR IV PUSH (FOR BLOOD PRESSURE SUPPORT): 80.0000 ug | PREFILLED_SYRINGE | INTRAVENOUS | Status: DC | PRN

## 2023-11-12 MED ORDER — HYDROXYZINE HCL 25 MG PO TABS: 50.0000 mg | ORAL_TABLET | Freq: Four times a day (QID) | ORAL | Status: DC | PRN

## 2023-11-12 MED ORDER — LACTATED RINGERS IV SOLN
500.0000 mL | Freq: Once | INTRAVENOUS | Status: AC
  Administered 2023-11-12: 500 mL via INTRAVENOUS

## 2023-11-12 MED ORDER — FENTANYL CITRATE (PF) 100 MCG/2ML IJ SOLN: 50.0000 ug | INTRAMUSCULAR | Status: DC | PRN

## 2023-11-12 MED ORDER — FENTANYL-BUPIVACAINE-NACL 0.5-0.125-0.9 MG/250ML-% EP SOLN
12.0000 mL/h | EPIDURAL | Status: DC | PRN
  Administered 2023-11-12: 12 mL/h via EPIDURAL
  Filled 2023-11-12: qty 250

## 2023-11-12 MED ORDER — LACTATED RINGERS IV SOLN: INTRAVENOUS | Status: DC

## 2023-11-12 MED ORDER — LACTATED RINGERS IV SOLN: 500.0000 mL | INTRAVENOUS | Status: DC | PRN

## 2023-11-12 MED ORDER — LIDOCAINE HCL (PF) 1 % IJ SOLN
INTRAMUSCULAR | Status: DC | PRN
  Administered 2023-11-12: 4 mL via SUBCUTANEOUS

## 2023-11-12 MED ORDER — LIDOCAINE-EPINEPHRINE (PF) 1.5 %-1:200000 IJ SOLN
INTRAMUSCULAR | Status: DC | PRN
  Administered 2023-11-12: 3 mL via EPIDURAL

## 2023-11-12 MED ORDER — LIDOCAINE HCL (PF) 1 % IJ SOLN
30.0000 mL | INTRAMUSCULAR | Status: DC | PRN
  Filled 2023-11-12: qty 30

## 2023-11-12 NOTE — Anesthesia Procedure Notes (Signed)
 Epidural Patient location during procedure: OB End time: 11/12/2023 8:29 PM  Staffing Performed: anesthesiologist   Preanesthetic Checklist Completed: patient identified, IV checked, site marked, risks and benefits discussed, surgical consent, monitors and equipment checked, pre-op evaluation and timeout performed  Epidural Patient position: sitting Prep: Betadine Patient monitoring: heart rate, continuous pulse ox and blood pressure Approach: midline Location: L4-L5 Injection technique: LOR saline  Needle:  Needle type: Tuohy  Needle gauge: 18 G Needle length: 9 cm and 9 Needle insertion depth: 5 cm Catheter type: closed end flexible Catheter size: 20 Guage Catheter at skin depth: 12 cm Test dose: negative and 1.5% lidocaine  with Epi 1:200 K  Assessment Sensory level: T10 Events: blood not aspirated, no cerebrospinal fluid, injection not painful, no injection resistance, no paresthesia and negative IV test  Additional Notes   Patient tolerated the insertion well without complications.-SATD -IVTD. No paresthesia. Refer to [facility] nursing for VS and dosingReason for block:procedure for pain

## 2023-11-12 NOTE — H&P (Signed)
 [facility] Labor & Delivery  History and Physical   HPI   Chief Complaint: uterine contractions  Emily Mosley is a 28 y.o. [phone] at [redacted]w[redacted]d who presents for contractions that began this morning at 10am, they have become more frequent & intense, now every 4-86m with increasing pelvic/vaginal pressure. Endorses fetal movement, denies vaginal bleeding or los of fluid.  Pregnancy complicated by shoulder dystocia in first pregnancy with 4kg baby at 41w, no neonatal injury.   Pregnancy Complications Patient Active Problem List   Diagnosis Date Noted   Pelvic pain affecting pregnancy 11/12/2023   Uterine contractions 11/12/2023   Supervision of other normal pregnancy, antepartum 07/04/2020   Anxiety 03/14/2020    Review of Systems A twelve point review of systems was negative except as stated in HPI.   HISTORY   Medications Medications Prior to Admission  Medication Sig Dispense Refill Last Dose/Taking   Prenatal Vit-Fe Fumarate-FA (MULTIVITAMIN-PRENATAL) 27-0.8 MG TABS tablet Take 1 tablet by mouth daily at 12 noon.       Allergies has no known allergies.   OB History OB History  Gravida Para Term Preterm AB Living  4 3 3  0 0 3  SAB IAB Ectopic Multiple Live Births  0 0 0 0 3    # Outcome Date GA Lbr Len/2nd Weight Sex Type Anes PTL Lv  4 Current           3 Term 10/12/20 [redacted]w[redacted]d / 00:12 2840 g F Vag-Spont EPI  LIV     Name: Tobia,GIRL Leticia     Apgar1: 8  Apgar5: 9  2 Term 01/30/18 [redacted]w[redacted]d / 00:08 3460 g M Vag-Spont EPI  LIV     Name: Chakraborty,BOY Letti     Apgar1: 8  Apgar5: 9  1 Term 03/24/14 [redacted]w[redacted]d  4173 g M Vag-Spont   LIV     Birth Comments: SHOULDER DYSTOCIA     Name: NOAH    Past Medical History Past Medical History:  Diagnosis Date   Encounter for supervision of other normal pregnancy, first trimester 06/10/2017   Clinic    [facility]    Prenatal Labs      Dating    LMP c/w 6wk US     Blood type: B/Positive/-- (05/08 1012)        Genetic Screen    1 Screen:    AFP:     Quad:     NIPS: declines    Antibody:Negative (05/08 1012)      Anatomic US          Rubella: 2.41 (05/08 1012) Varicella: Immune (05/08 0000)      GTT    Early:               Third trimester: 56    RPR: Non Reactive (10/03 1115)       Rhogam       Postpartum care following vaginal delivery 10/14/2020   Shoulder dystocia, delivered    with G1   Supervision of other normal pregnancy, antepartum 07/04/2020   Clinic    [facility], then [facility]    Prenatal Labs      Dating    US /LMP    Blood type:   B+      Genetic Screen    Declined    Antibody: Neg      Anatomic US     [facility], nml    Rubella:   Imm Varicella: Imm      GTT  Third trimester: 64    RPR:   NR      Rhogam    n/a    HBsAg:   NR      TDaP vaccine     09/13/2020   Flu Shot:    HIV:   Nrg      Baby Food    Breast    GBS: negative      Contracept    Past Surgical History Past Surgical History:  Procedure Laterality Date   APPENDECTOMY     EYE MUSCLE SURGERY     AGE 10   MIrena  IUD  2020   WISDOM TOOTH EXTRACTION  2015   ALL FOUR    Social History  reports that she has never smoked. She has never been exposed to tobacco smoke. She has never used smokeless tobacco. She reports that she does not drink alcohol and does not use drugs.   Family History family history includes Healthy in her father and mother; Thyroid disease in her maternal grandmother.   PHYSICAL EXAM   There were no vitals filed for this visit.  Constitutional: No acute distress, well appearing, and well nourished. Neurologic: She is alert and conversational.  Psychiatric: She has a normal mood and affect.  Musculoskeletal: Normal gait, grossly normal range of motion Cardiovascular: Normal rate.   Pulmonary/Chest: Normal work of breathing.  Gastrointestinal/Abdominal: Soft. Gravid. There is no tenderness.  Skin: Skin is warm and dry. No rash noted.  Genitourinary: Normal external female genitalia.  SVE: Dilation:  4 Effacement (%): 80 Cervical Position: Middle Station: -1, -2 Presentation: Vertex Exam by:: A Franks RN   NST Interpretation Indication: term labor  Baseline: 135 bpm Variability: moderate Accelerations: present Decelerations: absent Contractions: regular, every 3-4 minutes Time noted:  See OBIX Impression: reactive Authenticated by: Harlene LITTIE Cisco    PRENATAL LABS FROM OB RESULTS CONSOLE  ABO, Rh: B/Positive/-- (03/25 1102) Antibody: Negative (03/25 1102) Rubella: 1.64 (03/25 1102) Varicella: Reactive (03/25 1102) RPR: Non Reactive (07/22 1136)  HBsAg: Negative (03/25 1102)  HC Non Reactive (03/25 1102) HIV: Non Reactive (07/22 1136)  GC:  Negative 9/2 CT:   Lab Results  Component Value Date   CHLAMYDIAWP Negative 10/15/2023  ] 1h GTT:  Lab Results  Component Value Date   GLUCOSE 96 10/03/2013   GBS: Negative/-- (09/02 1407)  ENCOUNTER LABS   No results found for this or any previous visit (from the past 24 hours).  ASSESSMENT AND PLAN   Emily Mosley is a 28 y.o. [id number] at [redacted]w[redacted]d with EDD: 11/13/2023, by Ultrasound admitted for active labor.    Fetal Status: - cephalic presentation by Leopold's maneuvers and SVE, vertex by BSUS in clinic 9/9 - EFW: 7.5lb by Leopold's - CEFM - FHR currently category 1  GBS: negative Not indicated  Pain management: CLE  Hx of shoulder dystocia - Nursing staff aware   Labs/Immunizations: TDAP: Declined. Flu: Declined RSV: No Rubella: Immune Varicella: Immune   Postpartum Plan: - Feeding: Breast Milk - Contraception: plans undecided - Prenatal Care Provider: [facility]  Attending Dr. Fredirick was immediately available for the care of the patient.

## 2023-11-12 NOTE — OB Triage Note (Signed)
 Pt is a 28yo G4P3, 39w 6d. She arrived to the unit with complaints of contractions stating that they started around 1000 this morning coming q5-15mins and have increased in intensity and frequency to q4-39mins. 8/10 pain. She denies vaginal bleeding, reports positive fetal movement. VS stable, monitors applied and assessing.   Initial FHT 140 at 1849.

## 2023-11-12 NOTE — Discharge Summary (Signed)
 Postpartum Discharge Summary  Date of Service updated 11/14/2023     Patient Name: Emily Mosley DOB: 1995-12-30 MRN: [id number]  Date of admission: 11/12/2023 Delivery date:11/12/2023 Delivering provider: JAYNE HARLENE CROME Date of discharge: 11/14/2023  Admitting diagnosis: Pelvic pain affecting pregnancy [O26.899, R10.20] Uterine contractions [O47.9] Intrauterine pregnancy: [redacted]w[redacted]d     Secondary diagnosis:  Active Problems:   Postpartum care following vaginal delivery   Vaginal delivery     Discharge diagnosis: Term Pregnancy Delivered                                              Post partum procedures: none Augmentation: AROM Complications: None  Hospital course: Onset of Labor With Vaginal Delivery      28 y.o. yo [id number] at [redacted]w[redacted]d was admitted in Latent Labor on 11/12/2023. Labor course was uncomplicated.  Membrane Rupture Time/Date: 10:46 PM,11/12/2023  Delivery Method:Vaginal, Spontaneous Episiotomy: None Lacerations:  None Patient had an uncomplicated postpartum course. She is ambulating, tolerating a regular diet, passing flatus, and urinating well. Patient is discharged home in stable condition on 11/14/23.  Newborn Data: Birth date:11/12/2023 Birth time:10:49 PM Gender:Female Living status:Living Apgars:8 ,9  Weight:3720 g  Rhophylac:No MMR: No T-DaP: No Flu: No RSV Vaccine received: No Immunizations administered: Immunization History  Administered Date(s) Administered   Dtap, Unspecified 05/27/1996, 07/29/1996, 09/25/1996, 10/13/1997, 06/28/2000   HIB, Unspecified 07/29/1996, 09/25/1996, 12/18/1996, 06/25/1997   Hep B, Unspecified 02-05-97, 04/29/1996, 10/22/1996   IPV 05/27/1996, 07/29/1996, 10/13/1997, 06/28/2000   Influenza, Seasonal, Injecte, Preservative Fre 10/14/2013   Influenza,inj,Quad PF,6+ Mos 10/14/2014   Influenza-Unspecified 11/24/2019, 11/30/2020   MMR 03/31/1997, 06/28/2000   PFIZER Comirnaty(Gray Top)Covid-19 Tri-Sucrose  Vaccine 05/14/2019, 07/27/2019   PPD Test 11/17/2014, 08/29/2015, 08/27/2016, 09/08/2016   Tdap 08/08/2007, 09/08/2016, 09/13/2020   Varicella 03/31/1997, 08/08/2007    Physical exam  Vitals:   11/13/23 1202 11/13/23 1616 11/13/23 2300 11/14/23 0805  BP: 114/61 123/73 112/64 116/72  Pulse: 64 61 (!) 56 66  Resp: 18 18 17 18   Temp: 98.3 F (36.8 C) 98.6 F (37 C) 97.6 F (36.4 C) 98.5 F (36.9 C)  TempSrc: Oral Oral Oral Oral  SpO2:   100% 100%   General: alert, cooperative, and no distress Lochia: appropriate Uterine Fundus: firm DVT Evaluation: No evidence of DVT seen on physical exam. Labs: Lab Results  Component Value Date   WBC 17.7 (H) 11/13/2023   HGB 12.3 11/13/2023   HCT 35.6 (L) 11/13/2023   MCV 84.4 11/13/2023   PLT 185 11/13/2023      Latest Ref Rng & Units 10/03/2013    5:16 PM  CMP  Glucose 65 - 99 mg/dL 96   BUN 9 - 21 mg/dL 6   Creatinine 9.39 - 8.69 mg/dL 9.48   Sodium 867 - 858 mmol/L 139   Potassium 3.3 - 4.7 mmol/L 3.7   Chloride 97 - 107 mmol/L 104   CO2 16 - 25 mmol/L 23   Calcium  9.0 - 10.7 mg/dL 9.1   Total Protein 6.4 - 8.6 g/dL 7.4   Total Bilirubin 0.2 - 1.0 mg/dL 0.3   Alkaline Phos Unit/L 44   AST 0 - 26 Unit/L 26   ALT U/L 15    Edinburgh Score:    11/14/2023    8:03 AM  Edinburgh Postnatal Depression Scale Screening Tool  I have been able to  laugh and see the funny side of things. 0  I have looked forward with enjoyment to things. 0  I have blamed myself unnecessarily when things went wrong. 0  I have been anxious or worried for no good reason. 2  I have felt scared or panicky for no good reason. 0  Things have been getting on top of me. 0  I have been so unhappy that I have had difficulty sleeping. 0  I have felt sad or miserable. 0  I have been so unhappy that I have been crying. 0  The thought of harming myself has occurred to me. 0  Edinburgh Postnatal Depression Scale Total 2      After visit meds:  Allergies as  of 11/14/2023   No Known Allergies      Medication List     TAKE these medications    ibuprofen  600 MG tablet Commonly known as: ADVIL  Take 1 tablet (600 mg total) by mouth every 6 (six) hours.   multivitamin-prenatal 27-0.8 MG Tabs tablet Take 1 tablet by mouth daily at 12 noon.       Recommend 6 weeks of prophylactic anticoagulation with LMWH or subcutaneous unfractionated heparin if 1 or more high risk factor is present.  Recommend 14 days of prophylactic anticoagulation with LMWH or subcutaneous unfractionated heparin if 3 or more moderate risk factors are present.   Risk assessment for postpartum VTE and prophylactic treatment:  High risk factors: None Moderate risk factors: None  Postpartum VTE prophylaxis with LMWH not indicated    Discharge home in stable condition Infant Feeding: Breast Infant Disposition:home with mother Discharge instruction: per After Visit Summary and Postpartum booklet. Activity: Advance as tolerated. Pelvic rest for 6 weeks.  Diet: routine diet Anticipated Birth Control: Condoms and partner vasectomy Postpartum Appointment:6 weeks Additional Postpartum F/U: Postpartum Depression checkup Future Appointments: No future appointments.  Follow up Visit:  Follow-up Information     Jayne Harlene CROME, CNM. Schedule an appointment as soon as possible for a visit in 2 week(s).   Specialty: Certified Nurse Midwife Why: virtual postpartum visit Contact information: [address] [place] [address] [phone]         Jayne Harlene CROME, CNM. Schedule an appointment as soon as possible for a visit in 6 week(s).   Specialty: Certified Nurse Midwife Why: Postpartum visit Contact information: [address] [place] [address] [phone]                     11/14/2023 Colene Neighbor, SNM and [employer], CNM

## 2023-11-12 NOTE — Anesthesia Preprocedure Evaluation (Signed)
 Anesthesia Evaluation  Patient identified by MRN, date of birth, ID band Patient awake    Reviewed: Allergy & Precautions, H&P , NPO status , Patient's Chart, lab work & pertinent test results, reviewed documented beta blocker date and time   Airway Mallampati: II  TM Distance: >3 FB Neck ROM: full    Dental no notable dental hx. (+) Teeth Intact   Pulmonary neg pulmonary ROS   Pulmonary exam normal breath sounds clear to auscultation       Cardiovascular Exercise Tolerance: Good negative cardio ROS  Rhythm:regular Rate:Normal     Neuro/Psych   Anxiety     negative neurological ROS  negative psych ROS   GI/Hepatic negative GI ROS, Neg liver ROS,,,  Endo/Other  negative endocrine ROSdiabetes, Well Controlled, Gestational    Renal/GU      Musculoskeletal   Abdominal   Peds  Hematology negative hematology ROS (+)   Anesthesia Other Findings   Reproductive/Obstetrics (+) Pregnancy                              Anesthesia Physical Anesthesia Plan  ASA: 2  Anesthesia Plan: Epidural   Post-op Pain Management:    Induction:   PONV Risk Score and Plan:   Airway Management Planned:   Additional Equipment:   Intra-op Plan:   Post-operative Plan:   Informed Consent: I have reviewed the patients History and Physical, chart, labs and discussed the procedure including the risks, benefits and alternatives for the proposed anesthesia with the patient or authorized representative who has indicated his/her understanding and acceptance.       Plan Discussed with:   Anesthesia Plan Comments:         Anesthesia Quick Evaluation

## 2023-11-13 ENCOUNTER — Encounter: Admitting: Licensed Practical Nurse

## 2023-11-13 LAB — CBC
HCT: 35.6 % — ABNORMAL LOW (ref 36.0–46.0)
Hemoglobin: 12.3 g/dL (ref 12.0–15.0)
MCH: 29.1 pg (ref 26.0–34.0)
MCHC: 34.6 g/dL (ref 30.0–36.0)
MCV: 84.4 fL (ref 80.0–100.0)
Platelets: 185 K/uL (ref 150–400)
RBC: 4.22 MIL/uL (ref 3.87–5.11)
RDW: 12.4 % (ref 11.5–15.5)
WBC: 17.7 K/uL — ABNORMAL HIGH (ref 4.0–10.5)
nRBC: 0 % (ref 0.0–0.2)

## 2023-11-13 LAB — RPR: RPR Ser Ql: NONREACTIVE

## 2023-11-13 MED ORDER — SODIUM CHLORIDE 0.9% FLUSH: 3.0000 mL | INTRAVENOUS | Status: DC | PRN

## 2023-11-13 MED ORDER — FERROUS SULFATE 325 (65 FE) MG PO TABS
325.0000 mg | ORAL_TABLET | Freq: Every day | ORAL | Status: DC
  Administered 2023-11-13 – 2023-11-14 (×2): 325 mg via ORAL
  Filled 2023-11-13 (×2): qty 1

## 2023-11-13 MED ORDER — ZOLPIDEM TARTRATE 5 MG PO TABS: 5.0000 mg | ORAL_TABLET | Freq: Every evening | ORAL | Status: DC | PRN

## 2023-11-13 MED ORDER — DIPHENHYDRAMINE HCL 25 MG PO CAPS: 25.0000 mg | ORAL_CAPSULE | Freq: Four times a day (QID) | ORAL | Status: DC | PRN

## 2023-11-13 MED ORDER — TETANUS-DIPHTH-ACELL PERTUSSIS 5-2.5-18.5 LF-MCG/0.5 IM SUSY: 0.5000 mL | PREFILLED_SYRINGE | Freq: Once | INTRAMUSCULAR | Status: DC

## 2023-11-13 MED ORDER — INFLUENZA VIRUS VACC SPLIT PF (FLUZONE) 0.5 ML IM SUSY: 0.5000 mL | PREFILLED_SYRINGE | INTRAMUSCULAR | Status: DC | PRN

## 2023-11-13 MED ORDER — IBUPROFEN 600 MG PO TABS
600.0000 mg | ORAL_TABLET | Freq: Four times a day (QID) | ORAL | Status: DC
  Administered 2023-11-13 – 2023-11-14 (×6): 600 mg via ORAL
  Filled 2023-11-13 (×6): qty 1

## 2023-11-13 MED ORDER — ONDANSETRON HCL 4 MG/2ML IJ SOLN: 4.0000 mg | INTRAMUSCULAR | Status: DC | PRN

## 2023-11-13 MED ORDER — ACETAMINOPHEN 325 MG PO TABS
650.0000 mg | ORAL_TABLET | ORAL | Status: DC | PRN
  Administered 2023-11-13 – 2023-11-14 (×5): 650 mg via ORAL
  Filled 2023-11-13 (×5): qty 2

## 2023-11-13 MED ORDER — SODIUM CHLORIDE 0.9 % IV SOLN: 250.0000 mL | INTRAVENOUS | Status: AC | PRN

## 2023-11-13 MED ORDER — ONDANSETRON HCL 4 MG PO TABS: 4.0000 mg | ORAL_TABLET | ORAL | Status: DC | PRN

## 2023-11-13 MED ORDER — SIMETHICONE 80 MG PO CHEW: 80.0000 mg | CHEWABLE_TABLET | ORAL | Status: DC | PRN

## 2023-11-13 MED ORDER — DIBUCAINE (PERIANAL) 1 % EX OINT: 1.0000 | TOPICAL_OINTMENT | CUTANEOUS | Status: DC | PRN

## 2023-11-13 MED ORDER — SODIUM CHLORIDE 0.9% FLUSH
3.0000 mL | Freq: Two times a day (BID) | INTRAVENOUS | Status: DC
  Administered 2023-11-13 (×2): 3 mL via INTRAVENOUS

## 2023-11-13 MED ORDER — PRENATAL MULTIVITAMIN CH
1.0000 | ORAL_TABLET | Freq: Every day | ORAL | Status: DC
  Administered 2023-11-13 – 2023-11-14 (×2): 1 via ORAL
  Filled 2023-11-13 (×2): qty 1

## 2023-11-13 MED ORDER — WITCH HAZEL-GLYCERIN EX PADS: 1.0000 | MEDICATED_PAD | CUTANEOUS | Status: DC | PRN

## 2023-11-13 MED ORDER — BENZOCAINE-MENTHOL 20-0.5 % EX AERO: 1.0000 | INHALATION_SPRAY | CUTANEOUS | Status: DC | PRN

## 2023-11-13 MED ORDER — DOCUSATE SODIUM 100 MG PO CAPS
100.0000 mg | ORAL_CAPSULE | Freq: Two times a day (BID) | ORAL | Status: DC
  Administered 2023-11-13 – 2023-11-14 (×3): 100 mg via ORAL
  Filled 2023-11-13 (×3): qty 1

## 2023-11-13 MED ORDER — COCONUT OIL OIL
1.0000 | TOPICAL_OIL | Status: DC | PRN
  Administered 2023-11-13: 1 via TOPICAL
  Filled 2023-11-13: qty 7.5

## 2023-11-13 NOTE — Anesthesia Postprocedure Evaluation (Signed)
 Anesthesia Post Note  Patient: Emily Mosley  Procedure(s) Performed: AN AD HOC LABOR EPIDURAL  Patient location during evaluation: Mother Baby Anesthesia Type: Epidural Level of consciousness: oriented and awake and alert Pain management: pain level controlled Vital Signs Assessment: post-procedure vital signs reviewed and stable Respiratory status: spontaneous breathing and respiratory function stable Cardiovascular status: blood pressure returned to baseline and stable Postop Assessment: no headache, no backache, no apparent nausea or vomiting and able to ambulate Anesthetic complications: no   No notable events documented.   Last Vitals:  Vitals:   11/13/23 0803 11/13/23 0831  BP: 114/70 116/75  Pulse: 83 85  Resp: 19 20  Temp: 36.4 C 36.6 C  SpO2: 100% 100%    Last Pain:  Vitals:   11/13/23 0900  TempSrc:   PainSc: 3                  [facility]

## 2023-11-13 NOTE — Progress Notes (Signed)
 Post Partum Day 0 Subjective: Emily Mosley is feeling well overall. She is ambulating, voiding, and tolerating POs without difficulty. Her pain is well-controlled and her bleeding is WNL. Her mood is stable. Breastfeeding is going well.  Objective: Blood pressure 116/75, pulse 85, temperature 97.8 F (36.6 C), temperature source Oral, resp. rate 20, last menstrual period 01/23/2023, SpO2 100%, unknown if currently breastfeeding.  Physical Exam:  General: alert, cooperative, and appears stated age Lochia: appropriate Uterine Fundus: firm Laceration: N/A DVT Evaluation: No evidence of DVT seen on physical exam.  Recent Labs    11/12/23 1924 11/13/23 0545  HGB 12.7 12.3  HCT 36.7 35.6*    Assessment/Plan: Plan for discharge tomorrow Routine PP care   LOS: 1 day   Eleanor CHRISTELLA Canny, CNM 11/13/2023, 11:01 AM

## 2023-11-13 NOTE — Lactation Note (Signed)
 This note was copied from a baby's chart. Lactation Consultation Note  Patient Name: Boy Viha Kriegel Unijb'd Date: 11/13/2023 Age:27 hours Reason for consult: Initial assessment;Term   Maternal Data This is mom's 4th baby, SVD. No lactation risk factors noted.  On initial visit mom reports baby has been latching well, has had 1 wet and several stools. Mom has no questions at this time. LC reviewed breastfeeding basics and normative breastfeeding behaviors in the first 24 hours. Has patient been taught Hand Expression?: Yes Does the patient have breastfeeding experience prior to this delivery?: Yes How long did the patient breastfeed?: mom reports she has breastfed each of her children for a yaer.  Feeding Mother's Current Feeding Choice: Breast Milk   Interventions Interventions: Breast feeding basics reviewed;EducationLC number on white board if mom would like any LC assistance or has any question for LC.  Discharge Pump: Personal;Hands Free (Per mom she has S9 mom cozies and this pump worked well for her in the past.)  Consult Status Consult Status: PRN  Update provided to care nurse.   Avelina DELENA Gaskins 11/13/2023, 3:36 PM

## 2023-11-14 ENCOUNTER — Encounter: Payer: Self-pay | Admitting: Certified Nurse Midwife

## 2023-11-14 MED ORDER — IBUPROFEN 600 MG PO TABS: 600.0000 mg | ORAL_TABLET | Freq: Four times a day (QID) | ORAL | 0 refills | Status: AC

## 2023-11-14 NOTE — Discharge Instructions (Signed)
